# Patient Record
Sex: Male | Born: 1935 | Race: White | Hispanic: No | Marital: Married | Smoking: Former smoker
Health system: Southern US, Community
[De-identification: ages and names within clinical notes are randomized; demographics above are authoritative.]

## PROBLEM LIST (undated history)

## (undated) DIAGNOSIS — C44301 Unspecified malignant neoplasm of skin of nose: Secondary | ICD-10-CM

## (undated) DIAGNOSIS — E782 Mixed hyperlipidemia: Secondary | ICD-10-CM

## (undated) DIAGNOSIS — Z77090 Contact with and (suspected) exposure to asbestos: Secondary | ICD-10-CM

## (undated) DIAGNOSIS — N200 Calculus of kidney: Secondary | ICD-10-CM

## (undated) DIAGNOSIS — J449 Chronic obstructive pulmonary disease, unspecified: Secondary | ICD-10-CM

## (undated) DIAGNOSIS — N289 Disorder of kidney and ureter, unspecified: Secondary | ICD-10-CM

## (undated) DIAGNOSIS — G459 Transient cerebral ischemic attack, unspecified: Secondary | ICD-10-CM

## (undated) DIAGNOSIS — K5790 Diverticulosis of intestine, part unspecified, without perforation or abscess without bleeding: Secondary | ICD-10-CM

## (undated) DIAGNOSIS — I2510 Atherosclerotic heart disease of native coronary artery without angina pectoris: Secondary | ICD-10-CM

## (undated) HISTORY — DX: Calculus of kidney: N20.0

## (undated) HISTORY — DX: Diverticulosis of intestine, part unspecified, without perforation or abscess without bleeding: K57.90

## (undated) HISTORY — DX: Unspecified malignant neoplasm of skin of nose: C44.301

## (undated) HISTORY — DX: Disorder of kidney and ureter, unspecified: N28.9

## (undated) HISTORY — DX: Mixed hyperlipidemia: E78.2

## (undated) HISTORY — DX: Contact with and (suspected) exposure to asbestos: Z77.090

## (undated) HISTORY — DX: Transient cerebral ischemic attack, unspecified: G45.9

## (undated) HISTORY — DX: Atherosclerotic heart disease of native coronary artery without angina pectoris: I25.10

## (undated) HISTORY — DX: Chronic obstructive pulmonary disease, unspecified: J44.9

---

## 2004-08-07 DIAGNOSIS — K5790 Diverticulosis of intestine, part unspecified, without perforation or abscess without bleeding: Secondary | ICD-10-CM

## 2004-08-07 HISTORY — DX: Diverticulosis of intestine, part unspecified, without perforation or abscess without bleeding: K57.90

## 2004-08-07 HISTORY — PX: CORONARY ARTERY BYPASS GRAFT: SHX141

## 2005-03-08 ENCOUNTER — Encounter: Payer: Self-pay | Admitting: Cardiology

## 2005-03-09 ENCOUNTER — Inpatient Hospital Stay (HOSPITAL_COMMUNITY): Admission: AD | Admit: 2005-03-09 | Discharge: 2005-03-20 | Payer: Self-pay | Admitting: Cardiology

## 2005-03-09 ENCOUNTER — Ambulatory Visit: Payer: Self-pay | Admitting: Cardiology

## 2005-03-10 ENCOUNTER — Encounter: Payer: Self-pay | Admitting: Cardiology

## 2005-03-10 ENCOUNTER — Ambulatory Visit: Payer: Self-pay | Admitting: Cardiology

## 2005-03-11 ENCOUNTER — Encounter: Payer: Self-pay | Admitting: Cardiology

## 2005-03-15 ENCOUNTER — Encounter: Payer: Self-pay | Admitting: Cardiology

## 2005-03-18 ENCOUNTER — Encounter: Payer: Self-pay | Admitting: Cardiology

## 2005-03-19 ENCOUNTER — Encounter: Payer: Self-pay | Admitting: Thoracic Surgery (Cardiothoracic Vascular Surgery)

## 2005-04-05 ENCOUNTER — Ambulatory Visit: Payer: Self-pay | Admitting: *Deleted

## 2005-05-16 ENCOUNTER — Ambulatory Visit: Payer: Self-pay | Admitting: Cardiology

## 2007-12-12 ENCOUNTER — Encounter: Payer: Self-pay | Admitting: Cardiology

## 2008-01-27 ENCOUNTER — Encounter: Payer: Self-pay | Admitting: Cardiology

## 2008-01-29 ENCOUNTER — Ambulatory Visit: Payer: Self-pay | Admitting: Cardiology

## 2008-02-06 ENCOUNTER — Ambulatory Visit: Payer: Self-pay | Admitting: Cardiology

## 2009-02-17 ENCOUNTER — Encounter: Payer: Self-pay | Admitting: Cardiology

## 2009-03-04 ENCOUNTER — Encounter: Payer: Self-pay | Admitting: Cardiology

## 2009-11-24 ENCOUNTER — Encounter: Payer: Self-pay | Admitting: Cardiology

## 2010-03-31 ENCOUNTER — Encounter: Payer: Self-pay | Admitting: Cardiology

## 2010-10-06 ENCOUNTER — Ambulatory Visit (INDEPENDENT_AMBULATORY_CARE_PROVIDER_SITE_OTHER): Payer: Medicare Other | Admitting: Cardiology

## 2010-10-06 ENCOUNTER — Encounter: Payer: Self-pay | Admitting: Cardiology

## 2010-10-06 DIAGNOSIS — I2510 Atherosclerotic heart disease of native coronary artery without angina pectoris: Secondary | ICD-10-CM | POA: Insufficient documentation

## 2010-10-06 DIAGNOSIS — J449 Chronic obstructive pulmonary disease, unspecified: Secondary | ICD-10-CM | POA: Insufficient documentation

## 2010-10-06 DIAGNOSIS — J61 Pneumoconiosis due to asbestos and other mineral fibers: Secondary | ICD-10-CM | POA: Insufficient documentation

## 2010-10-06 DIAGNOSIS — Z8679 Personal history of other diseases of the circulatory system: Secondary | ICD-10-CM | POA: Insufficient documentation

## 2010-10-06 DIAGNOSIS — Z87891 Personal history of nicotine dependence: Secondary | ICD-10-CM | POA: Insufficient documentation

## 2010-10-06 DIAGNOSIS — N183 Chronic kidney disease, stage 3 (moderate): Secondary | ICD-10-CM

## 2010-10-06 DIAGNOSIS — J4489 Other specified chronic obstructive pulmonary disease: Secondary | ICD-10-CM | POA: Insufficient documentation

## 2010-10-06 DIAGNOSIS — E782 Mixed hyperlipidemia: Secondary | ICD-10-CM | POA: Insufficient documentation

## 2010-10-06 DIAGNOSIS — R072 Precordial pain: Secondary | ICD-10-CM | POA: Insufficient documentation

## 2010-10-06 DIAGNOSIS — Z951 Presence of aortocoronary bypass graft: Secondary | ICD-10-CM | POA: Insufficient documentation

## 2010-10-06 DIAGNOSIS — I872 Venous insufficiency (chronic) (peripheral): Secondary | ICD-10-CM | POA: Insufficient documentation

## 2010-10-13 NOTE — Cardiovascular Report (Signed)
Summary: Cardiac Catheterization  Cardiac Catheterization   Imported By: Dorise Hiss 10/06/2010 10:45:08  _____________________________________________________________________  External Attachment:    Type:   Image     Comment:   External Document

## 2010-10-13 NOTE — Consult Note (Signed)
Summary: Consultation Report/ CARDIOTHORACIC SURGERY  Consultation Report/ CARDIOTHORACIC SURGERY   Imported By: Dorise Hiss 10/06/2010 10:49:11  _____________________________________________________________________  External Attachment:    Type:   Image     Comment:   External Document

## 2010-10-13 NOTE — Consult Note (Signed)
Summary: CARDIOLOGY CONSULT/ [facility]  CARDIOLOGY CONSULT/ [facility]   Imported By: Zachary George 10/06/2010 10:32:15  _____________________________________________________________________  External Attachment:    Type:   Image     Comment:   External Document

## 2010-10-13 NOTE — Letter (Signed)
Summary: Discharge Summary  Discharge Summary   Imported By: Dorise Hiss 10/06/2010 10:54:44  _____________________________________________________________________  External Attachment:    Type:   Image     Comment:   External Document

## 2010-10-13 NOTE — Op Note (Signed)
Summary: Operative Report  Operative Report   Imported By: Dorise Hiss 10/06/2010 10:51:11  _____________________________________________________________________  External Attachment:    Type:   Image     Comment:   External Document

## 2010-10-25 NOTE — Assessment & Plan Note (Signed)
Summary: 1 yr fu per June reminder  vs   Visit Type:  Follow-up Primary Provider:  Sherril Croon   History of Present Illness: The patient is an 77 year old male seen in our clinic since 2009. He has a history of coronary artery disease, and coronary artery bypass grafting in 2006. He also has a history of asbestos exposure with chronic dyspnea. He has a prior history of TIA with an MRI demonstrating possible narrowing involving the terminal aspect of the right vertebral artery as well as mild to moderate narrowing involving the right middle cerebral artery. Carotid Dopplers currently were normal. At that time also had no residual symptoms. The patient also has history of renal insufficiency with creatinine 1.6. During the office visit in 2009 a Cardiolite study was recommended. The patient has been doing well.  The patient reports leg cramps.  He has been taking quinine which has helped.  He states the cramps occur mostly at night.  He has no symptoms of claudication upon walking.   From a cardiovascular standpoint his been doing well.  He reports no chest pain short of breath or cough or PND.   Preventive Screening-Counseling & Management  Alcohol-Tobacco     Smoking Status: quit     Year Quit: 1972  Current Medications (verified): 1)  Aspir-Low 81 Mg Tbec (Aspirin) .... Take 1 Tablet By Mouth Once A Day 2)  Metoprolol Succinate 50 Mg Xr24h-Tab (Metoprolol Succinate) .... Take 1/2 Tablet By Mouth Once A Day 3)  Flomax 0.4 Mg Caps (Tamsulosin Hcl) .... Take 1 Tablet By Mouth Once A Day 4)  Omeprazole 20 Mg Cpdr (Omeprazole) .... Take 1 Tablet By Mouth Once A Day 5)  Low Pressure Compression Stockings - Knee High .... Wear As Directed  Allergies (verified): 1)  ! * Contrast Dye  Comments:  Nurse/Medical Assistant: The patient's medication list and allergies were reviewed with the patient and were updated in the Medication and Allergy Lists.  Past History:  Past Medical History: Last  updated: 2010-10-26  Coronary artery bypass grafting in 2006.   Renal insufficiency, creatinine 1.6.   Transient ischemic attack, questionable right middle cerebral       artery event.       a.     No definite embolic source.     C O P D/asbestosis Hyperlipidemia Fromer tobacco use Nephrolithiasis Skin Cancer on the nose  Diverticulosis diag 2006 CAD  Family History: Last updated: 10/26/10 Father died in his 40's of old age but had hx of MI  Social History: Last updated: 10/26/10 Retired -[employer] Married  Tobacco Use - Former.   Risk Factors: Smoking Status: quit (10/26/10)  Review of Systems  The patient denies fatigue, malaise, fever, weight gain/loss, vision loss, decreased hearing, hoarseness, chest pain, palpitations, shortness of breath, prolonged cough, wheezing, sleep apnea, coughing up blood, abdominal pain, blood in stool, nausea, vomiting, diarrhea, heartburn, incontinence, blood in urine, muscle weakness, joint pain, leg swelling, rash, skin lesions, headache, fainting, dizziness, depression, anxiety, enlarged lymph nodes, easy bruising or bleeding, and environmental allergies.    Vital Signs:  Patient profile:   77 year old male Height:      71 inches Weight:      213 pounds BMI:     29.81 O2 Sat:      97 % on Room air Pulse rate:   77 / minute BP sitting:   125 / 78  (left arm) Cuff size:   large  Vitals Entered By: Isabelle Course  Anderson (October 06, 2010 1:40 PM)  Nutrition Counseling: Patient's BMI is greater than 25 and therefore counseled on weight management options.  O2 Flow:  Room air  Physical Exam  Additional Exam:  General: Well-developed, well-nourished in no distress head: Normocephalic and atraumatic eyes PERRLA/EOMI intact, conjunctiva and lids normal nose: No deformity or lesions mouth normal dentition, normal posterior pharynx neck: Supple, no JVD.  No masses, thyromegaly or abnormal cervical nodes lungs: Normal breath sounds  bilaterally without wheezing.  Normal percussion heart: regular rate and rhythm with normal S1 and S2, no S3 or S4.  PMI is normal.  No pathological murmurs abdomen: Normal bowel sounds, abdomen is soft and nontender without masses, organomegaly or hernias noted.  No hepatosplenomegaly musculoskeletal: Back normal, normal gait muscle strength and tone normal pulsus: Pulse is normal in all 4 extremities Extremities: No peripheral pitting edema, chronic venous insufficiency neurologic: Alert and oriented x 3 skin: Intact without lesions or rashes cervical nodes: No significant adenopathy psychologic: Normal affect    Impression & Recommendations:  Problem # 1:  CORONARY ATHEROSCLEROSIS NATIVE CORONARY ARTERY (ICD-414.01) Coronary artery disease: No recurrent chest pain.  No immediate indication for ischemia testing.  The patient's EKG was reviewed.  There is normal sinus rhythm with no acute ischemic changes or prior infarct patterns.  Coronary artery bypass grafting  His updated medication list for this problem includes:    Aspir-low 81 Mg Tbec (Aspirin) .Marland Kitchen... Take 1 tablet by mouth once a day    Metoprolol Succinate 50 Mg Xr24h-tab (Metoprolol succinate) .Marland Kitchen... Take 1/2 tablet by mouth once a day  Orders: T-Basic Metabolic Panel [phone]) T-Magnesium [phone])  Problem # 2:  TRANSIENT ISCHEMIC ATTACKS, HX OF (ICD-V12.50) History of TIA: Abnormal MRI with narrowing involving the right middle cerebral artery. Symptoms resolved.  Problem # 3:  CHRONIC KIDNEY DISEASE STAGE III (MODERATE) (ICD-585.3) Chronic renal insufficiency: Creatinine 1.6 in 2009: Rule follow-up with an electrolyte panel and a magnesium level  Problem # 4:  ASBESTOSIS (ICD-501) Asbestos exposure: Follow up at the [place].  Problem # 5:  VENOUS INSUFFICIENCY, CHRONIC (ICD-459.81) Venous insufficiency: The patient has no evidence of arterial insufficiency.  We will prescribed compression stockings.  Other  Orders: EKG w/ Interpretation (93000)  Patient Instructions: 1)  Labs:  BMET, Magnesium 2)  Low pressure compression stockings - knee high 3)  Follow up in  6 months Prescriptions: LOW PRESSURE COMPRESSION STOCKINGS - KNEE HIGH wear as directed  #1 x 1   Entered by:   Hoover Brunette, LPN   Authorized by:   Lewayne Bunting, MD, [facility]   Signed by:   Hoover Brunette, LPN on 29/56/2130   Method used:   Print then Give to Patient   RxID:   [id number]

## 2010-12-20 NOTE — Assessment & Plan Note (Signed)
[facility]                          [facility] NOTE   NAME:Roger [name], Roger [name]                     MRN:          [id number]  DATE:01/29/2008                            DOB:          1928/01/07    HISTORY OF PRESENT ILLNESS:  The patient is a 77 year old male with a  history of coronary artery disease, status post coronary artery bypass  graft in 2006.  The patient has a prior history of asbestos exposure.  He has chronic dyspnea and moderate-to-significant exertion.  The  patient was recently seen in the emergency room because of episodes of  dysarthria and facial weakness.  It was felt that he had a TIA.  An MRI  demonstrates possible narrowing involving the terminal aspect of right  vertebral artery as well as mild-to-moderate narrowing involving the  right middle cerebral artery.  The patient was discharged on aspirin.  He was not given Plavix.  There is apparently a history of side effects  and bleeding with Plavix.  He has had no residual symptoms.  He has told  me that he had carotid Dopplers done, and he confirmed that to me.  He  also has an echo done in Dr. Sherril Croon' office, but that showed no source of  emboli per the patient's report.  He denies, however, any significant  chest pain.  He has not had a perfusion study since coronary artery  bypass grafting.  His electrocardiogram in the office today demonstrates  normal sinus rhythm with no ischemic changes with left ventricular  hypertrophy.   MEDICATIONS:  1. Aspirin 81 mg p.o. daily.  2. Flomax 0.4 mg p.o. daily.  3. Metoprolol ER 50 mg p.o. daily.  4. Lipitor 10 mg p.o. daily.  5. Prilosec 40 mg p.o. daily.   PHYSICAL EXAMINATION:  VITAL SIGNS:  Blood pressure 108/68, heart rate  76, and weight 216 pounds.  NECK:  Normal carotid upstrokes, no carotid bruits.  LUNGS:  Clear breath sounds bilaterally.  HEART:  Regular rate and rhythm.  Normal S1 and S2.  No murmurs or  gallops.  ABDOMEN:  Soft and nontender.  No rebound or guarding.  Good bowel  sounds.  EXTREMITIES:  No cyanosis, clubbing, or edema.   PROBLEM LIST:  1. Coronary artery bypass grafting in 2006.  2. Renal insufficiency, creatinine 1.6.  3. Transient ischemic attack, questionable right middle cerebral      artery event.      a.     No definite embolic source.   PLAN:  1. The patient will have a Cardiolite study done, as he had no      ischemia evaluation since 2006.  2. The patient apparently could not take Plavix, but this can be      further explored by Dr. Sherril Croon.  3. No definite source of cardiogenic embolism, however.  4. From a cardiac perspective, the patient is otherwise stable.  I      made no changes in his medical regimen.     Learta Codding, MD,FACC  Electronically Signed    GED/MedQ  DD: 01/31/2008  DT:  02/01/2008  Job #: [id number]

## 2010-12-23 NOTE — Discharge Summary (Signed)
NAMEBOUBACAR, LERETTE NO.:  [id number]   MEDICAL RECORD NO.:  [id number]           PATIENT TYPE:   LOCATION:  2023                         FACILITY:  [facility]   PHYSICIAN:  Salvatore Decent. Cornelius Moras, M.D. DATE OF BIRTH:  1928/04/29   DATE OF ADMISSION:  03/09/2005  DATE OF DISCHARGE:                                 DISCHARGE SUMMARY   PRIMARY DIAGNOSIS:  Severe three-vessel coronary artery disease with new  onset symptoms of angina.   IN-HOSPITAL DIAGNOSES:  1.  Bleeding, status post coronary artery bypass grafting.  2.  Volume overload.  3.  Acute renal insufficiency postoperatively.   SECONDARY DIAGNOSES:  1.  Hyperlipidemia.  2.  Remote tobacco use.  3.  Remote history of asbestos exposure.  4.  Kidney stones.  5.  Benign prostatic hypertrophy.  6.  Skin cancer.   ALLERGIES:  No known drug allergies.   IN-HOSPITAL OPERATIONS/PROCEDURES:  1.  Cardiac catheterization with left heart catheterization, selective      coronary arteriography, selective left ventriculography, subclavian      angiography.  2.  Intraoperative transesophageal echocardiogram.  3.  Coronary artery bypass grafting x4 using a left internal mammary artery      to distal left anterior descending coronary artery, saphenous vein graft      to medial sub-branch of circumflex marginal branch, a sequential vein      graft to the lateral sub-branch of the circumflex marginal branch,      saphenous vein graft to posterior descending coronary artery.  Noted      endoscopic vein harvesting was done from the right thigh.  4.  Reexploration for bleeding.   HISTORY AND PHYSICAL/HOSPITAL COURSE:  Mr. Matlack is a 77 year old, retired,  [employer] male from [place], [place], with no previous history of coronary  artery disease and risk factors notable only for a remote history of tobacco  use and a history of hyperlipidemia.  Patient remains quite active  physically and is now an avid golfer.  He presents  with a one-week history  of new onset symptoms of classical angina that he describes as a substernal,  pressure-like pain and has typically been brought on with physical activity  or while he is outside in the extreme heat.  The pain only seems to be  relieved by sitting down and resting and getting out of the heat.  The pain  has occurred off and on over the last week, although he has remained active  physically otherwise.  He presented to [facility] on August  2nd where baseline electrocardiograms revealed a sinus rhythm with inferior  T-wave inversions.  Troponins were borderline elevated at 0.07 and 0.09.  He  was then transferred to [facility] where he underwent cardiac  catheterization by Dr. Riley Kill.  This was done on March 10, 2005.  Findings  at the time of catheterization included severe three-vessel coronary artery  disease with normal left ventricular function.  CVTS was then consulted.  The patient was seen and evaluated by Dr. Cornelius Moras.  Dr. Cornelius Moras discussed with  patient  undergoing coronary artery bypass grafting for these lesions.  He  discussed the risks and benefits of this procedure.  The patient  acknowledged understanding and wished to proceed.  He was scheduled for  surgery for March 15, 2005.  The patient underwent preoperative bilateral  carotid duplex ultrasound, which showed no evidence of significant ICA  stenosis.  The patient also had preoperative ABIs, which were within normal  limits.  On March 14, 2005, ultrasound was done to evaluate for a possible  pseudoaneurysm.  This showed to be negative for a pseudoaneurysm, but a  minor-sized hematoma in the groin.  The patient was stable.  The patient had  no other complications prior to surgery.   Patient was taken to the operating room on March 15, 2005, where he  underwent coronary artery bypass grafting x4 using a left internal mammary  artery graft to the distal left anterior descending  coronary artery, a  saphenous vein graft to medial sub-branch of circumflex marginal branch and  sequential saphenous vein graft to the lateral sub-branch of the circumflex  marginal branch, a saphenous vein to posterior descending coronary artery.  Noted endoscopic vein harvesting from the right thigh was done.  The patient  tolerated this procedure well and was transferred up to the ICU in stable  condition.  Following surgery, the patient seemed to be hemodynamically  stable, although his chest tube output has an excess of 900 mL since the OR.  The patient's platelet count was 93.  The patient received 2 units of fresh  frozen plasma and was taken back to the OR for bleeding, status post  coronary artery bypass grafting.  Dr. Cornelius Moras did reexploration for bleeding  and the bleeding was controlled, and patient was transferred back up to the  ICU.  The patient was extubated later in the later evening or early morning  following take back.  He was deemed to be hemodynamically stable following  take back.  Postoperatively, the patient remained hemodynamically stable.  He remained in normal sinus rhythm.  Chest tubes and lines were dc'd, day 1.  The patient was out of bed and ambulating well.  He was deemed to have  volume overload and started on diuretics.  The patient was transferred out  to 2000, postop day 2.  Day 3, the patient was deemed to have acute renal  insufficiency with a creatinine of 1.7.  The Lasix dose was decreased.  This  was followed.  The patient's incisions were dry and intact and healing well.  He was able to weaned off the oxygen on day 4, satting 92 to 93% on room  air.  The patient's acute renal insufficiency had improved over the next  several days.  His external pacing wires were dc'd on the morning of day 4.   The patient is tentatively ready for discharge postoperative day 5 in stable condition.  He was able to remain weaned off the oxygen.  He remained in  normal  sinus rhythm, and incisions dry and intact and healing well.  Respiratory, the patient did have positive rales noted in the bilateral  bases.  He was encouraged to continue his incentive spirometer.  The  patient's vital signs were stable at discharge and he was afebrile.   A followup appointment will be arranged with Dr. Cornelius Moras in 3 weeks.  The  patient will follow up with Dr. Riley Kill in 2 weeks.  He will obtain a PA and  lateral chest x-ray at Dr.  Stuckey's appointment and will then bring with  him to Dr. Orvan July appointment.  Mr. Doughtie received instructions on diet,  activity level, and incisional care.  He was told no driving until released  to do so, no heavy lifting over 10 pounds.  He was told that he is allowed  to shower, washing his incisions using soap and water.  He is to contact the  office if he develops any drainage or opening from any of his incision  sites.  The patient acknowledges understanding.  Mr. Mcburney was educated on  diet to be a low fat, low salt.  He was informed to ambulate 3 to 4 times  per day, progress as tolerated, and continue his breathing exercises.  He  again acknowledges understanding.   DISCHARGE MEDICATIONS:  1.  Aspirin 325 mg p.o. daily.  2.  Toprol-XL 50 mg p.o. daily.  3.  Lipitor 20 mg p.o. q.h.s.  4.  Flomax 0.4 mg p.o. daily.  5.  Lasix 40 mg p.o. daily x5 days.  6.  Potassium chloride 20 mEq p.o. daily x5 days.  7.  Oxycodone 5 mg 1 to 2 tabs p.o. q.4-6h p.r.n. pain.      Theda Belfast, PA      Salvatore Decent. Cornelius Moras, M.D.  Electronically Signed    KMD/MEDQ  D:  03/19/2005  T:  03/19/2005  Job:  [id number]   cc:   Arturo Morton. Riley Kill, M.D. [facility]

## 2010-12-23 NOTE — Op Note (Signed)
Roger [name], Roger [name] NO.:  [id number]   MEDICAL RECORD NO.:  [id number]          PATIENT TYPE:  INP   LOCATION:  2305                         FACILITY:  [facility]   PHYSICIAN:  Kaylyn Layer. Michelle Piper, M.D.   DATE OF BIRTH:  Aug 06, 1928   DATE OF PROCEDURE:  03/15/2005  DATE OF DISCHARGE:                                 OPERATIVE REPORT   PROCEDURE PERFORMED:  Transesophageal echo probe insertion and monitoring  during coronary artery bypass grafting.   INDICATIONS FOR PROCEDURE:  I was consulted by Salvatore Decent. Cornelius Moras, M.D. to  place a transesophageal echo probe into Mr. Dunkley for intraoperative  monitoring during his coronary artery bypass grafting procedure.  Mr. Cavanagh  is a 77 year old gentleman with three-vessel coronary artery disease who  presents to the operating room for coronary artery bypass grafting.   DESCRIPTION OF PROCEDURE:  After uneventful induction of anesthesia and  endotracheal intubation, the transesophageal echo probe was easily passed  into the stomach.  Initial short axis view of the left ventricle showed mild  left ventricular hypertrophy in all quadrants and no wall motion  abnormalities.  Turning to the four-chamber view, the mitral valve seemed to  coapt normally in all quadrants in all views and the anterior mitral leaflet  did seem a little redundant.  However, there was no mitral valve prolapse.  On placing Color Flow Doppler across the valve, there was a distinct trace  to 1+ jet of mitral regurgitation into the left atrium.  The left atrial  size was normal and flow in the pulmonary veins was forward.  Interatrial  septum was normal.  Turning to the aortic valve, it was tricuspid,  structurally looked normal.  On placing Color Flow Doppler across the valve  in the longitudinal view, there was trace to 1+ aortic insufficiency.  There  was a small central jet that did not extend past the anterior leaflet of the  mitral valve in the longitudinal  view.  The right side of the heart was  normal.   The patient was placed on cardiopulmonary bypass by Dr. Cornelius Moras, underwent a  coronary artery bypass grafting.  At the end of the procedure, the patient  was discontinued from cardiopulmonary bypass with minimal inotropes and at  this time evaluation showed left ventricle in short axis view to be  unchanged.  There is mild left ventricular hypertrophy with no wall motion  abnormalities and the ventricle seemed to contract normally.  In the 4-  chamber view, the mitral valve was unchanged from preop.  Structure looked  normal and on placing Color Flow Doppler across the valve, there was 1 to 2+  mitral regurgitation.  Left atrium was normal.  Interatrial septum was  normal.  Aortic valve was unchanged.  There was still trace to 1+ aortic  insufficiency.  The right side of the heart was within normal limits.   At end of the procedure, the transesophageal echo probe was removed from the  patient and the patient was taken intubated in stable condition to the  intensive care unit.  KDO/MEDQ  D:  03/15/2005  T:  03/16/2005  Job:  [id number]

## 2010-12-23 NOTE — Op Note (Signed)
NAMESAMMIE, DENNER NO.:  [id number]   MEDICAL RECORD NO.:  [id number]          PATIENT TYPE:  INP   LOCATION:  2305                         FACILITY:  [facility]   PHYSICIAN:  Salvatore Decent. Cornelius Moras, M.D. DATE OF BIRTH:  [birth year]   DATE OF PROCEDURE:  03/15/2005  DATE OF DISCHARGE:                                 OPERATIVE REPORT   PREOPERATIVE DIAGNOSIS:  Severe 3-vessel coronary artery disease with new-  onset symptoms angina.   POSTOPERATIVE DIAGNOSIS:  Severe 3-vessel coronary artery disease with new-  onset symptoms angina.   PROCEDURE:  Median sternotomy for coronary artery bypass grafting x4 (left  internal mammary artery to distal left anterior descending coronary artery,  saphenous vein graft to medial sub-branch of circumflex marginal branch and  sequential saphenous vein graft to the lateral sub-branch of the circumflex  marginal branch, saphenous vein graft to posterior descending coronary  artery, endoscopic saphenous vein harvest from right thigh).   SURGEON:  Dr. Purcell Nails.   ASSISTANT:  Coral Ceo, P.A.   ANESTHESIA:  General   BRIEF CLINICAL NOTE:  The patient is a 77 year old gentleman from [place],  [place] who presents with new-onset symptoms of angina. He was  initially evaluated at [facility]; and he was transferred to  [facility] where he underwent cardiac catheterization by Dr. Bonnee Quin on August4. Findings were notable for the presence of severe 3-  vessel coronary artery disease with normal left ventricular function. A full  consultation note has been dictated previously.   OPERATIVE CONSENT:  The patient and his family have been counseled at length  regarding the indications and potential benefits of coronary artery bypass  grafting. Alternative treatment strategies have been discussed. They  understand and accept all associated risks of surgery and desire to proceed  as described.   OPERATIVE FINDINGS:  1.  Normal left ventricular systolic function.  2.  Mild aortic insufficiency.  3.  Mild mitral regurgitation.  4.  Good-quality left internal mammary artery and saphenous vein conduit for      grafting.  5.  Good-quality distal target vessels for grafting.   OPERATIVE NOTE DETAIL:  The patient is brought to the operating room on the  and above-mentioned date and central monitoring is established by the  anesthesia service under the care and direction of Dr. Arta Bruce.  Specifically, a Swan-Ganz catheter is placed through the right internal  jugular approach. A radial arterial line was placed. Intravenous antibiotics  are administered. Following induction with general endotracheal anesthesia,  a Foley catheter is placed. The patient's chest, abdomen, both groins, and  both lower extremities are prepared, and draped in a sterile manner.   Baseline transesophageal echocardiogram is performed by Dr. Michelle Piper. This  demonstrates normal left ventricular function. There is mild aortic  insufficiency and mild mitral regurgitation.   A median sternotomy incision is performed and left internal mammary artery  is dissected from the chest wall and prepared for bypass grafting. The left  internal mammary artery is good-quality conduit. Simultaneously saphenous  vein  is obtained from the patient's right thigh using endoscopic vein  harvest technique. The saphenous vein is good-quality conduit. After the  saphenous vein is removed, the small incisions in the right leg are closed  in multiple layers with running absorbable suture.   The patient is heparinized systemically. The pericardium is opened. The  ascending aorta is mildly sclerotic but otherwise normal in appearance. The  ascending aorta and the right atrium are cannulated for cardiopulmonary  bypass. Cardiopulmonary bypass is begun and the surface of the heart is  inspected. Distal sites are selected for coronary bypass  grafting. Portions  of saphenous vein in the left internal mammary artery are trimmed to  appropriate length. A temperature probe is placed the left ventricular  septum. A cardioplegic catheter is placed in the ascending aorta.   The patient is cooled to 32-degrees systemic temperature. The aortic  crossclamp is applied and cardioplegia is delivered in an antegrade fashion  through the aortic root. Iced saline slush is applied for topical  hypothermia. The initial cardioplegic arrest and myocardial cooling are felt  to be excellent. Repeat doses of cardioplegia are administered  intermittently throughout the crossclamp portion of the operation, both  through the aortic root and down the subsequently placed vein grafts, to  maintain left ventricular septal temperature below 15-degrees centigrade.   The following distal coronary anastomoses are performed:  1.  Posterior descending coronary artery is grafted with a saphenous vein      graft in an end-to-side fashion. This vessel measures 1.8 mm in diameter      and is good quality at the site of distal bypass.  2.  The medial subbranch of the circumflex marginal branch is grafted with a      saphenous vein graft in a side-to-side fashion using a natural Y of the      saphenous vein conduit. The circumflex marginal is a large bifurcating      system. The medial subbranch is the smaller of the two branches. It      measures 1.1 mm in diameter at the site of distal bypass.  3.  The lateral subbranch of the circumflex marginal branch is grafted with      a sequential saphenous vein graft using the vein placed to the medial      subbranch of the same vessel. This vessel measures 1.7 mm in diameter      and is of good quality at the site of distal bypass.  4.  The distal left anterior descending coronary artery is grafted with the      left internal mammary artery in an end-to-side fashion. This vessel     measures 2.0 mm in diameter and is of  good quality at the site of distal      bypass.   Both proximal saphenous vein anastomoses are performed directly to the  ascending aorta prior to removal of the aortic crossclamp. The left  ventricular septal temperature rises rapidly with reperfusion of the left  internal mammary artery. The aortic crossclamp is removed after total  crossclamp time of 63 minutes. The heart begins to beat spontaneously. All  proximal and distal coronary anastomoses are inspected for hemostasis in  appropriate graft orientation. Epicardial pacing wires are fixed to the  right ventricular outflow tract and to the right atrial appendage. The  patient is rewarmed to 37-degrees centigrade temperature. The patient is  weaned from cardiopulmonary bypass without difficulty. The patient's rhythm  at separation from bypass  is normal sinus rhythm. No inotropic support is  required. Total cardiopulmonary bypass time for the operation 77 minutes.   Follow-up transesophageal echocardiogram performed by Dr. Michelle Piper after  separation from bypass demonstrates no significant changes with preserved  left ventricular function.   The venous and arterial cannulae are removed uneventfully. Protamine is  administered to reverse the anticoagulation. The mediastinum and the left  chest are irrigated with saline solution containing vancomycin. Meticulous  surgical hemostasis is ascertained. The mediastinum in the left chest are  drained using 3 chest tubes exited through separate stab incisions  inferiorly. The median sternotomy is closed in routine fashion using single  strength sternal wire. The soft tissues anterior to the sternum are closed  in multiple layers and the skin is closed with running subcuticular skin  closure.   The patient tolerated the procedure well and is transported to the surgical  intensive care unit in stable condition. There are no intraoperative  complications. All sponge, instrument and needle counts  are verified correct  at completion of the operation. No blood products were administered.       CHO/MEDQ  D:  03/15/2005  T:  03/16/2005  Job:  [id number]   cc:   Learta Codding, M.D. [facility]  [address]  [address]  [place]  [place] [address]   Arturo Morton. Riley Kill, M.D. [facility]  [address]  [address]  [place]  [place] [address]   Doreen Beam  [address]  [place]  [place] [address]  Fax: [phone]

## 2010-12-23 NOTE — Consult Note (Signed)
NAMEBROADUS, COSTILLA NO.:  [id number]   MEDICAL RECORD NO.:  [id number]          PATIENT TYPE:  INP   LOCATION:  2922                         FACILITY:  [facility]   PHYSICIAN:  Salvatore Decent. Cornelius Moras, M.D. DATE OF BIRTH:  [birth year]   DATE OF CONSULTATION:  03/11/2005  DATE OF DISCHARGE:                                   CONSULTATION   CARDIOTHORACIC SURGERY CONSULTATION REPORT   REQUESTING PHYSICIAN:  Dr. Arturo Morton. Stuckey.   PRIMARY CARDIOLOGIST:  Dr. Learta Codding   PRIMARY CARE PHYSICIAN:  Dr. Doreen Beam   REASON FOR CONSULTATION:  Severe 3-vessel coronary artery disease with new  onset symptoms angina.   HISTORY OF PRESENT ILLNESS:  Mr. Mellinger is a 77 year old retired [employer] male  from [place], [place] with no previous history of coronary artery  disease and risk factors notable only for a remote history of tobacco use  and history of hyperlipidemia. The patient remains quite active physically  and is now an avid golfer.  He presents with a 1-week history of new onset  symptoms of classical angina. He describes a substernal pressure-like pain  that has typically been brought on with physical activity or while he is  outside in the extreme heat. The pain always seems to be relieved by sitting  down and resting and getting out of the heat. They have occurred off-and-on  over the last week, although he has remained active physically otherwise. He  presented to [facility] on August 2 where baseline  electrocardiograms revealed sinus rhythm with inferior T-wave inversions.  Troponins were borderline elevated at 0.07 and 0.09.  He was transferred to  [facility] where he underwent cardiac catheterization, yesterday,  by Dr. Riley Kill. Findings at the time of catheterization include severe 3-  vessel coronary artery disease with normal left ventricular function.  Cardiac surgical consultation has been requested to consider surgical  revascularization.   REVIEW OF SYSTEMS:  GENERAL: The patient reports feeling well otherwise. He  has good appetite. He lost 20 pounds in weight over 6 months on a strict  diet regimen although he admits that he is already gained most of it back  recently. CARDIAC: Notable for new onset symptoms of angina. The patient  denies any symptoms of chest pain at rest or with minimal activity. He  denies any nocturnal chest pain. He has stable chronic mild exertional  shortness of breath that does not seem to limit him and this has not changed  recently. He denies resting shortness of breath, PND, orthopnea, or lower  extremity edema. He denies palpitations or syncope. RESPIRATORY: Notable for  stable chronic exertional shortness of breath. The patient blames this on  history of asbestosis but his symptoms have not changed. He denies  productive cough, hemoptysis, wheezing. GASTROINTESTINAL: Negative. The  patient has no difficulty swallowing. He reports normal bowel function. He  denies history of hematochezia, hematemesis, melena. He apparently had a  colonoscopy performed in the rate in the near past that revealed only a few  diverticula. MUSCULOSKELETAL: Negative. The patient denies  significant  problems with arthritis or arthralgias. NEUROLOGIC: Negative. HEENT: Notable  in that the patient has been treated recently, by his dentist, for a root  canal. He also has 1 tooth that is slightly loose in his upper jaw.  His eye  sight is stable. PSYCHIATRIC: Negative. ENDOCRINE: Negative.   PAST MEDICAL HISTORY:  1.  Hyperlipidemia.  2.  Remote tobacco use.  3.  Remote history asbestos exposure  4.  Kidney stones.  5.  Benign prostatic hypertrophy.  6.  Skin cancer.   PAST SURGICAL HISTORY:  Excision of skin cancer from his face and bilateral  cataract extraction.   FAMILY HISTORY:  The patient's father suffered a myocardial infarction in  the 75s but went on and lived into his 28s.    SOCIAL HISTORY:  The patient is retired from [employer] and [place]. He is an avid golfer and remains active  physically. He quit smoking 25 years ago. He denies history of significant  alcohol consumption.   MEDICATIONS:  Prior to admission, Flomax.   DRUG ALLERGIES:  None known.   PHYSICAL EXAM:  GENERAL:  The patient is a well-appearing [place] male who  appears his stated age in no acute distress.  VITAL SIGNS:  He is currently afebrile and normotensive. He is in normal  sinus rhythm.  HEENT:  Exam is grossly unrevealing.  NECK:  Supple. There is no cervical or supraclavicular lymphadenopathy.  There is no jugular venous distension. No carotid bruits are noted.  CHEST:  Auscultation of the chest is notable for clear AND symmetrical  breath sounds bilaterally. No wheezes or rhonchi demonstrated.  CARDIOVASCULAR:  Exam includes regular rate and rhythm. No murmurs, rubs or  gallops ARE noted.  ABDOMEN:  The abdomen is soft, nontender. There are no palpable masses.  Bowel sounds present. EXTREMITIES:  Warm and well-perfused. There is no  lower extremity edema. Distal pulses are palpable in both lower legs at the  ankles. There is no sign of significant venous insufficiency.  RECTAL AND GU EXAMS:  Both deferred.  NEUROLOGIC:  Examination is grossly nonfocal.  SKIN:  Clean and dry, healthy-appearing throughout.   DIAGNOSTIC TEST:  Cardiac catheterization, performed by Dr. Riley Kill, is  reviewed. This demonstrates severe 3-vessel coronary artery disease with  normal left ventricular function. Specifically, there is 95% stenosis of the  proximal left anterior descending coronary artery. This vessel is a very  large vessel that wraps the apex and fills the posterior descending coronary  artery via collaterals. There is 95% stenosis of left circumflex coronary  artery involving bifurcation of a large circumflex marginal system with a small-to-medium  first subbranch and a large second subbranch. There is 100%  proximal occlusion of the right coronary artery with left-to-right  collateral filling of the posterior descending coronary artery. Left  ventricular function appears normal.   IMPRESSION:  Severe 3-vessel coronary artery disease with new onset symptoms  of angina consistent with acute coronary syndrome. I believe that Mr. Ciotti  would best be treated by elective coronary artery bypass grafting.   PLAN:  I have discussed the options at length with Mr. Plog and his family.  Alternative treatment strategies have been discussed. They understand and  accept all associated risks of surgery including but not limited to risk of  death,  stroke, myocardial infarction, congestive heart failure, respiratory  failure, pneumonia, bleeding requiring blood transfusion, arrhythmia,  infection, and recurrent coronary artery disease. All  their questions have  been addressed. We tentatively plan to proceed with surgery on Wednesday  August 9.       CHO/MEDQ  D:  03/11/2005  T:  03/12/2005  Job:  [id number]   cc:   Learta Codding, M.D. [facility]  [address]  [address]  [place]  [place] [address]   Arturo Morton. Riley Kill, M.D. [facility]  [address]  [address]  [place]  [place] [address]   Doreen Beam  [address]  [place]  [place] [address]  Fax: [phone]   [facility] Cardiology Office  [place]  [place]

## 2010-12-23 NOTE — Cardiovascular Report (Signed)
Roger [name], DEROSSETT NO.:  [id number]   MEDICAL RECORD NO.:  [id number]          PATIENT TYPE:  INP   LOCATION:  4711                         FACILITY:  [facility]   PHYSICIAN:  Arturo Morton. Riley Kill, M.D. [facility] OF BIRTH:  08-Jun-1928   DATE OF PROCEDURE:  03/10/2005  DATE OF DISCHARGE:                              CARDIAC CATHETERIZATION   INDICATIONS:  The patient is a 77 year old gentleman who presents with a non-  ST-elevation MI.  He has had borderline troponins. The current study was  done to assess coronary anatomy.   PROCEDURES:  1.  Left heart catheterization.  2.  Selective coronary arteriography.  3.  Selective left ventriculography.  4.  Subclavian angiography.   DESCRIPTION OF PROCEDURE:  The patient was brought to the cath lab and  prepped and draped in usual fashion.  Through an anterior puncture the right  femoral artery was easily entered.  A 6-French sheath was placed.  Views of  the left-and-right coronary arteries were obtained in multiple angiographic  projections. Central aortic and left ventricular pressures were measured  with a pigtail. Ventriculography was performed in the RAO projection. We did  a hand injection into the central aorta to exclude aortic regurgitation. We  also did distal aortography to evaluate the area where there was initial  hang up of the wire. The patient tolerated procedure well. I held the groin  for the first 10 minutes; and the technical staff held for the second 10  minutes following the procedure. There were no complications.   HEMODYNAMIC DATA:  1.  Central aortic pressure 172/78, mean 114  2.  Left atrial pressure 168/16.  3.  No gradient pullback across aortic valve.  4.  Intravenous nitroglycerin was started at the completion of procedure.   ANGIOGRAPHIC DATA:  1.  Ventriculography was performed in the RAO projection. Overall systolic      function appeared well preserved. No segmental abnormalities  contraction      were identified  2.  A hand injection into the proximal aorta demonstrated no significant      aortic regurgitation.  3.  Distal aortography revealed bilateral patent renal arteries. There is a      fair amount of atherosclerotic change at the distal aortic area just      above the bifurcation.  There is also some atherosclerotic plaquing      within the right iliac.  There is minor plaquing in the proximal left      iliac.  Critical stenosis is not noted.  4.  The subclavian is patent and demonstrates a patent subclavian artery,      and patent internal mammary.  There is perhaps some mild narrowing at      the ostium of the vertebral artery. There is calcification in the      external part of the vessel.  5.  The right coronary artery is severely diseased proximally with an 80%      segmental narrowing and is totally occluded. There are bridging      collaterals to the distal vessel.  6.  The left main coronary artery is free of critical disease.  7.  The left anterior descending artery courses to the apex. There is a 90%      stenosis proximally. This 90% stenosis is after the first tiny diagonal      and straddles the second diagonal which itself has about 90% narrowing.      The distal vessel supplies a complete collateral to the posterior      descending and posterolateral system of the right coronary artery  It      completely fills retrograde in the vessel.  8.  The circumflex provides an AV circumflex and then there is a complex      stenosis involving the circumflex vessel. There is a 90% stenosis      followed by an 80% stenosis after the takeoff of the marginal.  The      marginal, itself, has about 78% ostial narrowing as well.   CONCLUSIONS:  1.  Well-preserved left ventricular function.  2.  Severe 3-vessel coronary disease with total occlusion of the right      coronary artery and high-grade lesions in the LAD and circumflex as      described above.   3.  Patent internal mammary vessel.   DISPOSITION:  We have started the patient on intravenous nitroglycerin and  are moving him to the Transitional Care Unit.  Low dose IV heparin is  recommended.  Surgical consultation has been obtained with Dr. __________  and I have made a call to him and he will see the patient in the morning for  possible surgery at the beginning of the week.       TDS/MEDQ  D:  03/10/2005  T:  03/11/2005  Job:  [id number]   cc:   Thomas C. Wall, M.D.   [facility]

## 2010-12-23 NOTE — Op Note (Signed)
[name], [name] NO.:  [id number]   MEDICAL RECORD NO.:  [id number]          PATIENT TYPE:  INP   LOCATION:  2305                         FACILITY:  [facility]   PHYSICIAN:  Salvatore Decent. Cornelius Moras, M.D. DATE OF BIRTH:  Jan 30, 1928   DATE OF PROCEDURE:  03/15/2005  DATE OF DISCHARGE:                                 OPERATIVE REPORT   PREOPERATIVE DIAGNOSIS:  Bleeding status post coronary artery bypass  grafting.   POSTOPERATIVE DIAGNOSIS:  Bleeding status post coronary artery bypass  grafting.   PROCEDURE:  Re-exploration for bleeding.   SURGEON:  Dr. Purcell Nails.   ASSISTANT:  Melvia Heaps, RNFA   ANESTHESIA:  General.   BRIEF CLINICAL NOTE:  The patient is a 77 year old male who underwent  coronary artery bypass grafting x 4 on the morning of March 15, 2005. After  returned to the surgical intensive care unit, the patient was noted to have  unacceptably high chest tube output. Coagulation parameters were mildly  abnormal, the chest tube output was felt to be excessive. The patient  remained hemodynamically stable. The patient was transfused platelets and  fresh frozen plasma to correct the coagulation abnormality and is now  brought directly to the operating room for re-exploration. The patient's  wife and family understands and provides consent for the procedure.   OPERATIVE NOTE IN DETAIL:  The patient brought directly from the surgical  intensive care unit to the operating room where he was replaced in the  supine position on the operating table. Adequate general endotracheal  anesthesia was verified under the care and direction of Dr. Michelle Piper. The  patient's existing chest tubes were removed. The patient's anterior chest  prepared and draped in sterile manner. The median sternotomy incision was  reopened. The sternal wires were removed. There was relatively minimal  amount of blood within the mediastinum and pleural space and what remaining  blood is  present is evacuated and the chest was irrigated with copious warm  saline solution. There is a single site of significant mechanical bleeding  identified in the midportion of saphenous vein graft to the posterior  descending coronary artery. In the midportion of this vein graft there is a  small bleeder along the wall of the vein at the base of the side branch.  This was controlled with a single 7-0 Prolene suture.  Further exhaustive  search for any other sites of mechanical bleeding was performed. There is  some mild bleeding from along the sternal edge that is easily controlled  with electrocautery. No other significant bleeding is encountered. There  does not appear to be significant coagulopathy. The mediastinum and left  chest are again irrigated with warm saline solution. The mediastinum and  left chest are drained using three chest tubes exited through the same  previous stab incisions inferiorly. The median sternotomy is reclosed with  single strength sternal wire and the soft tissues  anterior to sternum are closed in multiple layers. The skin is closed with  subcuticular skin closure. The patient is transported back to the surgical  intensive care  unit in stable condition. There are no intraoperative  complications. All sponge, instrument and needle counts were verified  correct at completion of the operation.       CHO/MEDQ  D:  03/15/2005  T:  03/16/2005  Job:  [id number]

## 2012-01-19 ENCOUNTER — Encounter: Payer: Self-pay | Admitting: *Deleted

## 2012-01-23 ENCOUNTER — Encounter: Payer: Self-pay | Admitting: Cardiology

## 2012-01-23 ENCOUNTER — Ambulatory Visit (INDEPENDENT_AMBULATORY_CARE_PROVIDER_SITE_OTHER): Payer: Medicare Other | Admitting: Cardiology

## 2012-01-23 VITALS — BP 134/70 | HR 76 | Ht 71.0 in | Wt 213.0 lb

## 2012-01-23 DIAGNOSIS — I2510 Atherosclerotic heart disease of native coronary artery without angina pectoris: Secondary | ICD-10-CM

## 2012-01-23 DIAGNOSIS — Z8679 Personal history of other diseases of the circulatory system: Secondary | ICD-10-CM

## 2012-01-23 DIAGNOSIS — E7800 Pure hypercholesterolemia, unspecified: Secondary | ICD-10-CM

## 2012-01-23 DIAGNOSIS — R072 Precordial pain: Secondary | ICD-10-CM

## 2012-01-23 DIAGNOSIS — R0602 Shortness of breath: Secondary | ICD-10-CM

## 2012-01-23 DIAGNOSIS — J61 Pneumoconiosis due to asbestos and other mineral fibers: Secondary | ICD-10-CM

## 2012-01-23 NOTE — Assessment & Plan Note (Signed)
No recurrent symptoms of TIA.

## 2012-01-23 NOTE — Assessment & Plan Note (Signed)
Patient is status post coronary bypass grafting. This was performed in 2006. Abnormal stress test in 2009. The patient is actually doing quite well. He reports no recurrent chest pain. No indication for ischemia workup presently.

## 2012-01-23 NOTE — Assessment & Plan Note (Signed)
The patient reports no recurrent chest pain. However he needs an evaluation of LV function. He has developed increased shortness of breath since last year. I've ordered an echocardiogram to assess LV function as well as pulmonary artery pressures.

## 2012-01-23 NOTE — Progress Notes (Signed)
Peyton Bottoms, MD, [facility] ABIM Board Certified in Adult Cardiovascular Medicine,Internal Medicine and Critical Care Medicine    CC: Followup patient with a history of coronary artery disease.  HPI:  The patient has not been seen in the clinic for several years. He has a history of coronary artery disease and status post coronary bypass grafting in 2006. He had an ischemia workup in 2009. He reports no recurrent chest pain but has noticed increasing shortness of breath on exertion. Additionally however the patient also has underlying lung disease and asbestosis, for which she is followed very closely. The patient denies any orthopnea or PND. He reports no palpitations or syncope.  PMH: reviewed and listed in Problem List in Electronic Records (and see below) Past Medical History  Diagnosis Date  . CAD (coronary artery disease)   . Renal insufficiency   . TIA (transient ischemic attack)     questionable right middle cerebral artery event  . COPD (chronic obstructive pulmonary disease)     asbestosis  . Other and unspecified hyperlipidemia   . Nephrolithiasis   . Skin cancer of nose   . Diverticulosis 2006   Past Surgical History  Procedure Date  . Coronary artery bypass graft 2006    Allergies/SH/FHX : available in Electronic Records for review  Allergies  Allergen Reactions  . Iodinated Diagnostic Agents    History   Social History  . Marital Status: Married    Spouse Name: N/A    Number of Children: N/A  . Years of Education: N/A   Occupational History  .      Retired from [employer]   Social History Main Topics  . Smoking status: Former Smoker -- 1.0 packs/day for 25 years    Types: Cigarettes    Quit date: 08/08/2003  . Smokeless tobacco: Former Neurosurgeon    Types: Chew    Quit date: 08/08/2003  . Alcohol Use: Not on file  . Drug Use: Not on file  . Sexually Active: Not on file   Other Topics Concern  . Not on file   Social History Narrative  . No narrative  on file   Family History  Problem Relation Age of Onset  . Heart attack Father     Medications: Current Outpatient Prescriptions  Medication Sig Dispense Refill  . aspirin EC 81 MG tablet Take 81 mg by mouth daily.      . metoprolol succinate (TOPROL-XL) 50 MG 24 hr tablet Take 25 mg by mouth daily. Take with or immediately following a meal.      . omeprazole (PRILOSEC) 20 MG capsule Take 20 mg by mouth daily.      . Tamsulosin HCl (FLOMAX) 0.4 MG CAPS Take 0.4 mg by mouth daily.        ROS: No nausea or vomiting. No fever or chills.No melena or hematochezia.No bleeding.No claudication  Physical Exam: BP 134/70  Pulse 76  Ht 5\' 11"  (1.803 m)  Wt 213 lb (96.616 kg)  BMI 29.71 kg/m2 General: Neck: Lungs: Cardiac: Vascular: Skin: Physcologic:  12lead ECG: Limited bedside ECHO:N/A No images are attached to the encounter.   Assessment and Plan  ASBESTOSIS Followed at the [facility] in [place]. No mesothelioma. No significant lung disease apparently  CORONARY ATHEROSCLEROSIS NATIVE CORONARY ARTERY Patient is status post coronary bypass grafting. This was performed in 2006. Abnormal stress test in 2009. The patient is actually doing quite well. He reports no recurrent chest pain. No indication for ischemia workup presently.  PURE HYPERCHOLESTEROLEMIA Patient unable to tolerate statins. He has tried several preparations. He reports significant muscle weakness and myalgias.  TRANSIENT ISCHEMIC ATTACKS, HX OF No recurrent symptoms of TIA.  PRECORDIAL PAIN The patient reports no recurrent chest pain. However he needs an evaluation of LV function. He has developed increased shortness of breath since last year. I've ordered an echocardiogram to assess LV function as well as pulmonary artery pressures.   Patient Active Problem List  Diagnosis  . PURE HYPERCHOLESTEROLEMIA  . CAD  . CORONARY ATHEROSCLEROSIS NATIVE CORONARY ARTERY  . COPD  . ASBESTOSIS  . PRECORDIAL PAIN  . PERS  HX TOBACCO USE PRESENTING HAZARDS HEALTH  . POSTSURGICAL AORTOCORONARY BYPASS STATUS  . VENOUS INSUFFICIENCY, CHRONIC  . CHRONIC KIDNEY DISEASE STAGE III (MODERATE)  . TRANSIENT ISCHEMIC ATTACKS, HX OF

## 2012-01-23 NOTE — Assessment & Plan Note (Signed)
Patient unable to tolerate statins. He has tried several preparations. He reports significant muscle weakness and myalgias.

## 2012-01-23 NOTE — Patient Instructions (Signed)
   Echo  Our office will notify of results.   Your physician wants you to follow up in:  1 year.  You will receive a reminder letter in the mail one-two months in advance.  If you don't receive a letter, please call our office to schedule the follow up appointment

## 2012-01-23 NOTE — Assessment & Plan Note (Signed)
Followed at the [facility] in [place]. No mesothelioma. No significant lung disease apparently

## 2012-02-14 ENCOUNTER — Other Ambulatory Visit: Payer: Self-pay

## 2012-02-14 ENCOUNTER — Other Ambulatory Visit (INDEPENDENT_AMBULATORY_CARE_PROVIDER_SITE_OTHER): Payer: Medicare Other

## 2012-02-14 DIAGNOSIS — R0602 Shortness of breath: Secondary | ICD-10-CM

## 2012-02-14 DIAGNOSIS — I2510 Atherosclerotic heart disease of native coronary artery without angina pectoris: Secondary | ICD-10-CM

## 2012-02-22 ENCOUNTER — Telehealth: Payer: Self-pay | Admitting: *Deleted

## 2012-02-22 NOTE — Telephone Encounter (Signed)
Notes Recorded by Lesle Chris, LPN on 1/61/0960 at 12:20 PM Patient notified and verbalized understanding.   ------  Notes Recorded by Lesle Chris, LPN on 4/54/0981 at 10:42 AM Spoke with cleaning person at patient home, asked to call back later this afternoon

## 2012-02-22 NOTE — Telephone Encounter (Signed)
Message copied by Lesle Chris on Thu Feb 22, 2012 12:20 PM ------      Message from: Prescott Parma C      Created: Thu Feb 15, 2012 12:23 PM       NL LVF (EF 55%), no sig valve abns

## 2013-01-31 ENCOUNTER — Ambulatory Visit (INDEPENDENT_AMBULATORY_CARE_PROVIDER_SITE_OTHER): Payer: Medicare Other | Admitting: Cardiology

## 2013-01-31 ENCOUNTER — Encounter: Payer: Self-pay | Admitting: Cardiology

## 2013-01-31 VITALS — BP 123/75 | HR 75 | Ht 70.5 in | Wt 213.1 lb

## 2013-01-31 DIAGNOSIS — J61 Pneumoconiosis due to asbestos and other mineral fibers: Secondary | ICD-10-CM

## 2013-01-31 DIAGNOSIS — I2510 Atherosclerotic heart disease of native coronary artery without angina pectoris: Secondary | ICD-10-CM

## 2013-01-31 DIAGNOSIS — E782 Mixed hyperlipidemia: Secondary | ICD-10-CM

## 2013-01-31 NOTE — Patient Instructions (Addendum)

## 2013-01-31 NOTE — Progress Notes (Signed)
Clinical Summary Roger [name] is an 79 y.o.male presenting for office visit. He was last seen in June 2013 by Dr. Andee Lineman. He reports chronic decrease in stamina, no progressive shortness of breath or angina however. He is overdue to have a complete physical with Dr. Sherril Croon.  Records reviewed. He has a history of statin intolerance. He has been managed medically for multivessel CAD status post CABG. Echocardiogram in July 2013 revealed LVEF 55%, grade 1 diastolic dysfunction, mild left atrial enlargement, PA systolic pressure 44 mm mercury, no major valvular abnormalities. Previous Cardiolite in 2009 demonstrated no obvious ischemia, LVEF 56%.  ECG today shows normal sinus rhythm. Today we discussed his medications, the importance of compliance. Also touched on the possibility of ultimately considering a followup stress test, however with stable symptoms we elected to pursue observation only at this time.   Allergies  Allergen Reactions  . Iodinated Diagnostic Agents     Current Outpatient Prescriptions  Medication Sig Dispense Refill  . albuterol (PROVENTIL HFA;VENTOLIN HFA) 108 (90 BASE) MCG/ACT inhaler Inhale 2 puffs into the lungs every 6 (six) hours as needed for wheezing.      Marland Kitchen aspirin EC 81 MG tablet Take 81 mg by mouth daily.      . metoprolol succinate (TOPROL-XL) 50 MG 24 hr tablet Take 25 mg by mouth daily. Take with or immediately following a meal.      . omeprazole (PRILOSEC) 20 MG capsule Take 20 mg by mouth daily.      . Tamsulosin HCl (FLOMAX) 0.4 MG CAPS Take 0.4 mg by mouth daily.       No current facility-administered medications for this visit.    Past Medical History  Diagnosis Date  . Coronary atherosclerosis of native coronary artery   . Renal insufficiency   . TIA (transient ischemic attack)     Questionable right middle cerebral artery event  . COPD (chronic obstructive pulmonary disease)   . Mixed hyperlipidemia   . Nephrolithiasis   . Skin cancer of nose     . Diverticulosis 2006  . Asbestos exposure     Followed in [place]    Past Surgical History  Procedure Laterality Date  . Coronary artery bypass graft  2006    LIMA to LAD, SVG to OM, SVG to OM, SVG to PDA    Social History Roger [name] reports that he quit smoking about 9 years ago. His smoking use included Cigarettes. He has a 25 pack-year smoking history. He quit smokeless tobacco use about 9 years ago. His smokeless tobacco use included Chew. Roger [name] reports that he does not drink alcohol.  Review of Systems No palpitations, dizziness, syncope. No claudication. No reported bleeding episodes.  Physical Examination Filed Vitals:   01/31/13 1333  BP: 123/75  Pulse: 75   Filed Weights   01/31/13 1333  Weight: 213 lb 1.9 oz (96.671 kg)   Overweight male, comfortable at rest. HEENT: Conjunctiva and lids normal, oropharynx clear. Neck: Supple, no elevated JVP or carotid bruits, no thyromegaly. Lungs: Course but clear to auscultation, nonlabored breathing at rest. Cardiac: Regular rate and rhythm, no S3, 2/6 systolic murmur, no pericardial rub. Abdomen: Soft, nontender, bowel sounds present. Extremities: No pitting edema, distal pulses 2+. Skin: Warm and dry. Musculoskeletal: No kyphosis. Neuropsychiatric: Alert and oriented x3, affect grossly appropriate.   Problem List and Plan   CORONARY ATHEROSCLEROSIS NATIVE CORONARY ARTERY Symptomatically stable on medical therapy status post CABG as outlined above. Plan to continue medications and observation  for now.  Mixed hyperlipidemia History of statin intolerance. I did recommend that he followup with Dr. Sherril Croon for routine physical and lab work.  Asbestosis(501) No history of mesothelioma based on his report. He is followed in [place].    Jonelle Sidle, M.D., F.A.C.C.

## 2013-01-31 NOTE — Assessment & Plan Note (Signed)
No history of mesothelioma based on his report. He is followed in [place].

## 2013-01-31 NOTE — Assessment & Plan Note (Signed)
Symptomatically stable on medical therapy status post CABG as outlined above. Plan to continue medications and observation for now.

## 2013-01-31 NOTE — Assessment & Plan Note (Signed)
History of statin intolerance. I did recommend that he followup with Dr. Sherril Croon for routine physical and lab work.

## 2014-02-09 ENCOUNTER — Inpatient Hospital Stay (HOSPITAL_COMMUNITY)
Admission: EM | Admit: 2014-02-09 | Discharge: 2014-02-13 | DRG: 246 | Disposition: A | Discharge status: Home / Self Care | Payer: Medicare Other | Source: Other Acute Inpatient Hospital | Attending: Cardiovascular Disease | Admitting: Cardiovascular Disease

## 2014-02-09 ENCOUNTER — Encounter (HOSPITAL_COMMUNITY): Payer: Self-pay | Admitting: Critical Care Medicine

## 2014-02-09 ENCOUNTER — Inpatient Hospital Stay (HOSPITAL_COMMUNITY): Payer: Medicare Other | Admitting: Critical Care Medicine

## 2014-02-09 ENCOUNTER — Encounter (HOSPITAL_COMMUNITY): Payer: Medicare Other | Admitting: Critical Care Medicine

## 2014-02-09 ENCOUNTER — Encounter (HOSPITAL_COMMUNITY)
Admission: EM | Disposition: A | Discharge status: Home / Self Care | Payer: Medicare Other | Source: Other Acute Inpatient Hospital | Attending: Cardiovascular Disease

## 2014-02-09 ENCOUNTER — Inpatient Hospital Stay (HOSPITAL_COMMUNITY): Payer: Medicare Other

## 2014-02-09 DIAGNOSIS — Z85828 Personal history of other malignant neoplasm of skin: Secondary | ICD-10-CM

## 2014-02-09 DIAGNOSIS — Z87891 Personal history of nicotine dependence: Secondary | ICD-10-CM

## 2014-02-09 DIAGNOSIS — I469 Cardiac arrest, cause unspecified: Secondary | ICD-10-CM

## 2014-02-09 DIAGNOSIS — J9600 Acute respiratory failure, unspecified whether with hypoxia or hypercapnia: Secondary | ICD-10-CM

## 2014-02-09 DIAGNOSIS — Z87442 Personal history of urinary calculi: Secondary | ICD-10-CM | POA: Diagnosis not present

## 2014-02-09 DIAGNOSIS — Z79899 Other long term (current) drug therapy: Secondary | ICD-10-CM | POA: Diagnosis not present

## 2014-02-09 DIAGNOSIS — I471 Supraventricular tachycardia, unspecified: Secondary | ICD-10-CM | POA: Diagnosis not present

## 2014-02-09 DIAGNOSIS — J4489 Other specified chronic obstructive pulmonary disease: Secondary | ICD-10-CM | POA: Diagnosis present

## 2014-02-09 DIAGNOSIS — I25810 Atherosclerosis of coronary artery bypass graft(s) without angina pectoris: Secondary | ICD-10-CM

## 2014-02-09 DIAGNOSIS — N183 Chronic kidney disease, stage 3 unspecified: Secondary | ICD-10-CM | POA: Diagnosis present

## 2014-02-09 DIAGNOSIS — I4719 Other supraventricular tachycardia: Secondary | ICD-10-CM

## 2014-02-09 DIAGNOSIS — J449 Chronic obstructive pulmonary disease, unspecified: Secondary | ICD-10-CM | POA: Diagnosis present

## 2014-02-09 DIAGNOSIS — I369 Nonrheumatic tricuspid valve disorder, unspecified: Secondary | ICD-10-CM

## 2014-02-09 DIAGNOSIS — Z8673 Personal history of transient ischemic attack (TIA), and cerebral infarction without residual deficits: Secondary | ICD-10-CM | POA: Diagnosis not present

## 2014-02-09 DIAGNOSIS — I2582 Chronic total occlusion of coronary artery: Secondary | ICD-10-CM | POA: Diagnosis present

## 2014-02-09 DIAGNOSIS — E875 Hyperkalemia: Secondary | ICD-10-CM | POA: Diagnosis present

## 2014-02-09 DIAGNOSIS — Y9210 Unspecified residential institution as the place of occurrence of the external cause: Secondary | ICD-10-CM | POA: Diagnosis not present

## 2014-02-09 DIAGNOSIS — K5730 Diverticulosis of large intestine without perforation or abscess without bleeding: Secondary | ICD-10-CM | POA: Diagnosis present

## 2014-02-09 DIAGNOSIS — D539 Nutritional anemia, unspecified: Secondary | ICD-10-CM | POA: Diagnosis present

## 2014-02-09 DIAGNOSIS — I4891 Unspecified atrial fibrillation: Secondary | ICD-10-CM | POA: Diagnosis not present

## 2014-02-09 DIAGNOSIS — I255 Ischemic cardiomyopathy: Secondary | ICD-10-CM

## 2014-02-09 DIAGNOSIS — I2119 ST elevation (STEMI) myocardial infarction involving other coronary artery of inferior wall: Secondary | ICD-10-CM | POA: Diagnosis present

## 2014-02-09 DIAGNOSIS — Z77090 Contact with and (suspected) exposure to asbestos: Secondary | ICD-10-CM | POA: Diagnosis not present

## 2014-02-09 DIAGNOSIS — Y831 Surgical operation with implant of artificial internal device as the cause of abnormal reaction of the patient, or of later complication, without mention of misadventure at the time of the procedure: Secondary | ICD-10-CM | POA: Diagnosis not present

## 2014-02-09 DIAGNOSIS — I498 Other specified cardiac arrhythmias: Secondary | ICD-10-CM | POA: Diagnosis not present

## 2014-02-09 DIAGNOSIS — Z8249 Family history of ischemic heart disease and other diseases of the circulatory system: Secondary | ICD-10-CM

## 2014-02-09 DIAGNOSIS — Z888 Allergy status to other drugs, medicaments and biological substances status: Secondary | ICD-10-CM

## 2014-02-09 DIAGNOSIS — J9601 Acute respiratory failure with hypoxia: Secondary | ICD-10-CM

## 2014-02-09 DIAGNOSIS — Z7982 Long term (current) use of aspirin: Secondary | ICD-10-CM | POA: Diagnosis not present

## 2014-02-09 DIAGNOSIS — J61 Pneumoconiosis due to asbestos and other mineral fibers: Secondary | ICD-10-CM | POA: Diagnosis present

## 2014-02-09 DIAGNOSIS — E782 Mixed hyperlipidemia: Secondary | ICD-10-CM | POA: Diagnosis present

## 2014-02-09 DIAGNOSIS — I959 Hypotension, unspecified: Secondary | ICD-10-CM | POA: Diagnosis present

## 2014-02-09 DIAGNOSIS — I480 Paroxysmal atrial fibrillation: Secondary | ICD-10-CM

## 2014-02-09 DIAGNOSIS — I2510 Atherosclerotic heart disease of native coronary artery without angina pectoris: Secondary | ICD-10-CM | POA: Diagnosis present

## 2014-02-09 DIAGNOSIS — R079 Chest pain, unspecified: Secondary | ICD-10-CM | POA: Diagnosis present

## 2014-02-09 DIAGNOSIS — I2589 Other forms of chronic ischemic heart disease: Secondary | ICD-10-CM | POA: Diagnosis present

## 2014-02-09 DIAGNOSIS — I519 Heart disease, unspecified: Secondary | ICD-10-CM | POA: Diagnosis not present

## 2014-02-09 DIAGNOSIS — Z91041 Radiographic dye allergy status: Secondary | ICD-10-CM

## 2014-02-09 DIAGNOSIS — Z7902 Long term (current) use of antithrombotics/antiplatelets: Secondary | ICD-10-CM | POA: Diagnosis not present

## 2014-02-09 HISTORY — PX: PERCUTANEOUS CORONARY STENT INTERVENTION (PCI-S): SHX5485

## 2014-02-09 HISTORY — PX: TEMPORARY PACEMAKER INSERTION: SHX5471

## 2014-02-09 HISTORY — PX: LEFT HEART CATHETERIZATION WITH CORONARY ANGIOGRAM: SHX5451

## 2014-02-09 LAB — BLOOD GAS, ARTERIAL
Acid-base deficit: 3.2 mmol/L — ABNORMAL HIGH (ref 0.0–2.0)
Bicarbonate: 21.5 mEq/L (ref 20.0–24.0)
Drawn by: 27553
FIO2: 100 %
O2 SAT: 98.9 %
PATIENT TEMPERATURE: 98.6
PEEP: 5 cmH2O
RATE: 14 resp/min
TCO2: 22.7 mmol/L (ref 0–100)
VT: 600 mL
pCO2 arterial: 39.9 mmHg (ref 35.0–45.0)
pH, Arterial: 7.35 (ref 7.350–7.450)
pO2, Arterial: 153 mmHg — ABNORMAL HIGH (ref 80.0–100.0)

## 2014-02-09 LAB — POCT ACTIVATED CLOTTING TIME: ACTIVATED CLOTTING TIME: 416 s

## 2014-02-09 LAB — TROPONIN I: TROPONIN I: 13.27 ng/mL — AB (ref <0.30)

## 2014-02-09 LAB — TSH: TSH: 2.16 u[IU]/mL (ref 0.350–4.500)

## 2014-02-09 LAB — LIPID PANEL
Cholesterol: 176 mg/dL (ref 0–200)
HDL: 44 mg/dL (ref >39)
LDL CALC: 112 mg/dL — AB (ref 0–99)
Total CHOL/HDL Ratio: 4 RATIO
Triglycerides: 100 mg/dL (ref <150)
VLDL: 20 mg/dL (ref 0–40)

## 2014-02-09 LAB — CK TOTAL AND CKMB (NOT AT ARMC)
CK TOTAL: 57 U/L (ref 7–232)
CK, MB: 1.9 ng/mL (ref 0.3–4.0)
RELATIVE INDEX: INVALID (ref 0.0–2.5)

## 2014-02-09 LAB — GLUCOSE, CAPILLARY: Glucose-Capillary: 155 mg/dL — ABNORMAL HIGH (ref 70–99)

## 2014-02-09 LAB — HEMOGLOBIN A1C
Hgb A1c MFr Bld: 6.3 % — ABNORMAL HIGH (ref <5.7)
Mean Plasma Glucose: 134 mg/dL — ABNORMAL HIGH (ref <117)

## 2014-02-09 LAB — MRSA PCR SCREENING: MRSA BY PCR: NEGATIVE

## 2014-02-09 LAB — PRO B NATRIURETIC PEPTIDE: Pro B Natriuretic peptide (BNP): 357.6 pg/mL (ref 0–450)

## 2014-02-09 SURGERY — LEFT HEART CATHETERIZATION WITH CORONARY ANGIOGRAM
Anesthesia: LOCAL | Laterality: Left

## 2014-02-09 MED ORDER — FENTANYL CITRATE 0.05 MG/ML IJ SOLN: 50.0000 ug | Freq: Once | INTRAMUSCULAR | Status: DC

## 2014-02-09 MED ORDER — SODIUM CHLORIDE 0.9 % IV SOLN
0.2500 mg/kg/h | INTRAVENOUS | Status: AC
  Filled 2014-02-09: qty 250

## 2014-02-09 MED ORDER — CHLORHEXIDINE GLUCONATE 0.12 % MT SOLN
15.0000 mL | Freq: Two times a day (BID) | OROMUCOSAL | Status: DC
  Administered 2014-02-09 – 2014-02-10 (×2): 15 mL via OROMUCOSAL
  Filled 2014-02-09 (×2): qty 15

## 2014-02-09 MED ORDER — PANTOPRAZOLE SODIUM 40 MG PO TBEC: 40.0000 mg | DELAYED_RELEASE_TABLET | Freq: Every day | ORAL | Status: DC

## 2014-02-09 MED ORDER — FENTANYL BOLUS VIA INFUSION
25.0000 ug | INTRAVENOUS | Status: DC | PRN
  Filled 2014-02-09: qty 50

## 2014-02-09 MED ORDER — NITROGLYCERIN 0.4 MG SL SUBL: 0.4000 mg | SUBLINGUAL_TABLET | SUBLINGUAL | Status: DC | PRN

## 2014-02-09 MED ORDER — ALPRAZOLAM 0.25 MG PO TABS: 0.2500 mg | ORAL_TABLET | Freq: Two times a day (BID) | ORAL | Status: DC | PRN

## 2014-02-09 MED ORDER — SODIUM CHLORIDE 0.9 % IV SOLN
3.0000 g | Freq: Three times a day (TID) | INTRAVENOUS | Status: DC
  Administered 2014-02-09 – 2014-02-10 (×3): 3 g via INTRAVENOUS
  Filled 2014-02-09 (×5): qty 3

## 2014-02-09 MED ORDER — INSULIN ASPART 100 UNIT/ML ~~LOC~~ SOLN
2.0000 [IU] | SUBCUTANEOUS | Status: DC
  Administered 2014-02-09: 4 [IU] via SUBCUTANEOUS

## 2014-02-09 MED ORDER — SODIUM CHLORIDE 0.9 % IJ SOLN: 3.0000 mL | INTRAMUSCULAR | Status: DC | PRN

## 2014-02-09 MED ORDER — TICAGRELOR 90 MG PO TABS
90.0000 mg | ORAL_TABLET | Freq: Two times a day (BID) | ORAL | Status: DC
  Administered 2014-02-09 – 2014-02-13 (×8): 90 mg via ORAL
  Filled 2014-02-09 (×9): qty 1

## 2014-02-09 MED ORDER — ONDANSETRON HCL 4 MG/2ML IJ SOLN: 4.0000 mg | Freq: Four times a day (QID) | INTRAMUSCULAR | Status: DC | PRN

## 2014-02-09 MED ORDER — PANTOPRAZOLE SODIUM 40 MG IV SOLR
40.0000 mg | Freq: Every day | INTRAVENOUS | Status: DC
  Administered 2014-02-09: 40 mg via INTRAVENOUS
  Filled 2014-02-09 (×2): qty 40

## 2014-02-09 MED ORDER — ENOXAPARIN SODIUM 40 MG/0.4ML ~~LOC~~ SOLN
40.0000 mg | SUBCUTANEOUS | Status: DC
  Administered 2014-02-10 – 2014-02-11 (×2): 40 mg via SUBCUTANEOUS
  Filled 2014-02-09 (×3): qty 0.4

## 2014-02-09 MED ORDER — SODIUM CHLORIDE 0.9 % IJ SOLN
3.0000 mL | Freq: Two times a day (BID) | INTRAMUSCULAR | Status: DC
  Administered 2014-02-09: 3 mL via INTRAVENOUS

## 2014-02-09 MED ORDER — SODIUM CHLORIDE 0.9 % IJ SOLN
3.0000 mL | Freq: Two times a day (BID) | INTRAMUSCULAR | Status: DC
  Administered 2014-02-10 – 2014-02-11 (×2): 3 mL via INTRAVENOUS

## 2014-02-09 MED ORDER — ASPIRIN 81 MG PO CHEW
81.0000 mg | CHEWABLE_TABLET | Freq: Every day | ORAL | Status: DC
  Administered 2014-02-10 – 2014-02-13 (×4): 81 mg via ORAL
  Filled 2014-02-09 (×4): qty 1

## 2014-02-09 MED ORDER — SODIUM CHLORIDE 0.9 % IV SOLN
1.0000 mL/kg/h | INTRAVENOUS | Status: AC
  Administered 2014-02-09: 1 mL/kg/h via INTRAVENOUS

## 2014-02-09 MED ORDER — INSULIN ASPART 100 UNIT/ML ~~LOC~~ SOLN
2.0000 [IU] | Freq: Three times a day (TID) | SUBCUTANEOUS | Status: DC
  Administered 2014-02-10: 4 [IU] via SUBCUTANEOUS
  Administered 2014-02-10 – 2014-02-11 (×4): 2 [IU] via SUBCUTANEOUS
  Administered 2014-02-12: 6 [IU] via SUBCUTANEOUS

## 2014-02-09 MED ORDER — FENTANYL CITRATE 0.05 MG/ML IJ SOLN
12.5000 ug | INTRAMUSCULAR | Status: DC | PRN
  Administered 2014-02-10: 12.5 ug via INTRAVENOUS
  Filled 2014-02-09: qty 2

## 2014-02-09 MED ORDER — SODIUM CHLORIDE 0.9 % IV SOLN
0.0000 mg/h | INTRAVENOUS | Status: DC
  Filled 2014-02-09: qty 10

## 2014-02-09 MED ORDER — MIDAZOLAM BOLUS VIA INFUSION
1.0000 mg | INTRAVENOUS | Status: DC | PRN
  Filled 2014-02-09: qty 2

## 2014-02-09 MED ORDER — TAMSULOSIN HCL 0.4 MG PO CAPS
0.4000 mg | ORAL_CAPSULE | Freq: Every day | ORAL | Status: DC
  Administered 2014-02-10 – 2014-02-13 (×3): 0.4 mg via ORAL
  Filled 2014-02-09 (×4): qty 1

## 2014-02-09 MED ORDER — METOPROLOL TARTRATE 25 MG/10 ML ORAL SUSPENSION
12.5000 mg | Freq: Two times a day (BID) | ORAL | Status: DC
  Administered 2014-02-09: 12.5 mg via ORAL
  Filled 2014-02-09 (×3): qty 5

## 2014-02-09 MED ORDER — ACETAMINOPHEN 325 MG PO TABS: 650.0000 mg | ORAL_TABLET | ORAL | Status: DC | PRN

## 2014-02-09 MED ORDER — ALBUTEROL SULFATE (2.5 MG/3ML) 0.083% IN NEBU: 3.0000 mL | INHALATION_SOLUTION | Freq: Four times a day (QID) | RESPIRATORY_TRACT | Status: DC | PRN

## 2014-02-09 MED ORDER — SODIUM CHLORIDE 0.9 % IV SOLN
0.0000 ug/h | INTRAVENOUS | Status: DC
  Filled 2014-02-09: qty 50

## 2014-02-09 MED ORDER — FENTANYL CITRATE 0.05 MG/ML IJ SOLN: 50.0000 ug | INTRAMUSCULAR | Status: DC | PRN

## 2014-02-09 MED ORDER — ACETAMINOPHEN 325 MG PO TABS
650.0000 mg | ORAL_TABLET | ORAL | Status: DC | PRN
  Administered 2014-02-10: 650 mg via ORAL
  Filled 2014-02-09: qty 2

## 2014-02-09 MED ORDER — BIOTENE DRY MOUTH MT LIQD
15.0000 mL | Freq: Two times a day (BID) | OROMUCOSAL | Status: DC
  Administered 2014-02-09: 15 mL via OROMUCOSAL

## 2014-02-09 MED ORDER — TICAGRELOR 90 MG PO TABS
180.0000 mg | ORAL_TABLET | Freq: Once | ORAL | Status: AC
  Administered 2014-02-09: 180 mg via ORAL
  Filled 2014-02-09: qty 2

## 2014-02-09 MED ORDER — SODIUM CHLORIDE 0.9 % IV SOLN: 250.0000 mL | INTRAVENOUS | Status: DC | PRN

## 2014-02-09 MED ORDER — METOPROLOL SUCCINATE ER 25 MG PO TB24: 25.0000 mg | ORAL_TABLET | Freq: Every day | ORAL | Status: DC

## 2014-02-09 MED ORDER — MIDAZOLAM HCL 2 MG/2ML IJ SOLN: 1.0000 mg | INTRAMUSCULAR | Status: DC | PRN

## 2014-02-09 MED ORDER — ATROPINE SULFATE 0.1 MG/ML IJ SOLN
INTRAMUSCULAR | Status: AC
  Filled 2014-02-09: qty 10

## 2014-02-09 MED ORDER — ASPIRIN EC 81 MG PO TBEC: 81.0000 mg | DELAYED_RELEASE_TABLET | Freq: Every day | ORAL | Status: DC

## 2014-02-09 MED ORDER — ZOLPIDEM TARTRATE 5 MG PO TABS: 5.0000 mg | ORAL_TABLET | Freq: Every evening | ORAL | Status: DC | PRN

## 2014-02-09 MED ORDER — ONDANSETRON HCL 4 MG/2ML IJ SOLN
4.0000 mg | Freq: Four times a day (QID) | INTRAMUSCULAR | Status: DC | PRN
  Administered 2014-02-12: 4 mg via INTRAVENOUS
  Filled 2014-02-09: qty 2

## 2014-02-09 NOTE — H&P (Signed)
History and Physical   Patient ID: Mariana Goytia MRN: [id number], DOB/AGE: 1928/05/23 80 y.o. Date of Encounter: 02/09/2014  Primary Physician: Glenda Chroman., MD Primary Cardiologist: Dr. Domenic Polite  Chief Complaint:  Inferior STEMI  HPI: Yordan Martindale is a 80 y.o. male with a history of CAD s/p 4 vessel CABG 2006. He was last seen by Dr. Domenic Polite in June of 2014 and was doing well.  Recently, Mr. Dinovo has been having episodes of chest pain/indigestion. These have not been exertional and have been relieved with TUMS.  Today, he woke about 6 AM with his usual indigestion feeling. It was a 5/10. He may have been a little short of breath with this but admits that his respiratory status at baseline is poor and is not sure it was any worse today. He was nauseated and sweaty. Since he thought was indigestion, he took Pepto-Bismol and TUMS with no relief. He went to [facility]. He was still having discomfort and his ECG was consistent with an inferior STEMI. He was given sublingual nitroglycerin, heparin, Zofran, Brilinta, and a 500 cc fluid bolus. He was transported emergently to [facility] and taken directly to the Cath Lab. Upon arrival to the cath lab he was still nauseated and had vomiting. His chest pain is currently a 3/10.   Past Medical History  Diagnosis Date  . Coronary atherosclerosis of native coronary artery   . Renal insufficiency   . TIA (transient ischemic attack)     Questionable right middle cerebral artery event  . COPD (chronic obstructive pulmonary disease)   . Mixed hyperlipidemia   . Nephrolithiasis   . Skin cancer of nose   . Diverticulosis 2006  . Asbestos exposure     Followed in [place]    Surgical History:  Past Surgical History  Procedure Laterality Date  . Coronary artery bypass graft  2006    LIMA to LAD, SVG to OM, SVG to OM, SVG to PDA     I have reviewed the patient's current medications. Prior to Admission medications     Medication Sig Start Date End Date Taking? Authorizing Provider  albuterol (PROVENTIL HFA;VENTOLIN HFA) 108 (90 BASE) MCG/ACT inhaler Inhale 2 puffs into the lungs every 6 (six) hours as needed for wheezing.    Historical Provider, MD  aspirin EC 81 MG tablet Take 81 mg by mouth daily.    Historical Provider, MD  metoprolol succinate (TOPROL-XL) 50 MG 24 hr tablet Take 25 mg by mouth daily. Take with or immediately following a meal.    Historical Provider, MD  omeprazole (PRILOSEC) 20 MG capsule Take 20 mg by mouth daily.    Historical Provider, MD  Tamsulosin HCl (FLOMAX) 0.4 MG CAPS Take 0.4 mg by mouth daily.    Historical Provider, MD   Allergies:  Allergies  Allergen Reactions  . Iodinated Diagnostic Agents   . Statins     Has tried several, muscle weakness and myalgias    History   Social History  . Marital Status: Married    Spouse Name: N/A    Number of Children: N/A  . Years of Education: N/A   Occupational History  .      Retired from [employer]  . Smoking status: Former Smoker -- 1.00 packs/day for 25 years    Types: Cigarettes    Quit date: 08/08/2003  . Smokeless tobacco: Former Systems developer    Types: Loss adjuster, chartered  Quit date: 08/08/2003  . Alcohol Use: No  . Drug Use: No  . Sexual Activity: Not on file   Other Topics Concern  . Not on file   Social History Narrative   Lives with family    Family History  Problem Relation Age of Onset  . Heart attack Father    Family Status  Relation Status Death Age  . Father Deceased     Died in 58's    Review of Systems:   Full 14-point review of systems otherwise negative except as noted above.  Physical Exam: Height 5' 10.5" (1.791 m), weight 211 lb 10.3 oz (96 kg). General: Well developed, well nourished,male elderly Head: Normocephalic, atraumatic, sclera non-icteric, no xanthomas, nares are without discharge. Dentition: poor Neck: No carotid bruits. JVD not elevated. No  thyromegally Lungs: Good expansion bilaterally. without wheezes or rhonchi.  Heart: Regular rate and rhythm with S1 S2.  No S3 or S4.  No murmur, no rubs, or gallops appreciated. Abdomen: Soft, non-tender, non-distended with normoactive bowel sounds. No hepatomegaly. No rebound/guarding. No obvious abdominal masses. Msk:  Strength and tone appear normal for age. No joint deformities or effusions, no spine or costo-vertebral angle tenderness. Extremities: No clubbing or cyanosis. No edema.  Distal pedal pulses are 2+ in upper extrem decreased in both lower extremities, femoral pulses and DP pulses bilaterally. No femoral bruits appreciated Neuro: Alert and oriented X 3. Moves all extremities spontaneously. No focal deficits noted. Psych:  Responds to questions appropriately with a normal affect. Skin: No rashes or lesions noted  Labs: performed at [facility] Creatinine 1.63, initial troponin 0.01, otherwise no significant abnormalities   Radiology/Studies: pending   ECG: sinus rhythm, rate 61. Inferior ST elevation noted  ASSESSMENT AND PLAN:  Principal Problem:   ST elevation myocardial infarction (STEMI) of inferior wall, initial episode of care - Patient is being taken directly to the Cath Lab with further evaluation and treatment depending on the results. We will screen for cardiac risk factors and their control. He will be continued on his home medications and we will check a chest x-ray.  Otherwise, continue home medications Active Problems:   Mixed hyperlipidemia   COPD   CHRONIC KIDNEY DISEASE STAGE III (MODERATE)   Signed, Rosaria Ferries, PA-C 02/09/2014 12:53 PM Beeper [phone]  Patient seen, examined. Available data reviewed. Agree with findings, assessment, and plan as outlined by Rosaria Ferries, PA-C. 80 year-old gentleman with acute inferior STEMI and ongoing ischemic sx's. He is brought directly from [facility] to the cardiac cath lab for emergency cath  and possible PCI. Exam reveals an elderly male in moderate distress, somewhat stoic. Lungs with coarse BS, heart distant and regular without murmur or gallop. No peripheral edema. Pedal pulses diminished, fem pulses palpable 1+ bilaterally. EKG with NSR and inferior ST elevation c/w acute infarct. Plan emergency cath +/- PCI. I have reviewed his old cath films and CABG op report. Otherwise as outlined above by Rosaria Ferries, PA-C. Changes made above where appropriate.   Sherren Mocha, M.D. 02/09/2014 1:24 PM

## 2014-02-09 NOTE — Procedures (Signed)
Extubation Procedure Note  Patient Details:   Name: Wetzel Meester DOB: 16-Dec-1927 MRN: [id number]   Airway Documentation:     Evaluation  O2 sats: stable throughout Complications: No apparent complications Patient did tolerate procedure well. Bilateral Breath Sounds: Diminished Suctioning: Oral;Airway Yes  Patient extubated to [place]. No complications. Vital signs stable. RN at bedside. RT will continue to monitor.  Mcneil Sober 02/09/2014, 5:32 PM

## 2014-02-09 NOTE — Progress Notes (Signed)
Arterial and venus sheathes removes. Manual pressure held for 30 minutes. Patient tolerated well. Pressure dressing applied to sheath site. Patient was educated about keeping his leg straight for six hours and he verbalized understanding.

## 2014-02-09 NOTE — Progress Notes (Signed)
Patient extubated by RT at 1700. Post extubation pt voices no complaints. VS including oxygen saturation remain stable. He has been placed on 2L of supplemental O2 via nasal canula. Will continue to monitor.   Georgann Housekeeper, ACNP [facility] Pulmonology/Critical Care Pager [phone] or [phone]

## 2014-02-09 NOTE — Progress Notes (Signed)
ANTIBIOTIC CONSULT NOTE - INITIAL  Pharmacy Consult for Unasyn Indication: aspiration pneumonia  Allergies  Allergen Reactions  . Iodinated Diagnostic Agents Other (See Comments)    Causes intermediate diaphoresis and hypotension  . Statins Other (See Comments)    Has tried several, muscle weakness and myalgias    Patient Measurements: Height: 5' 10.5" (179.1 cm) Weight: 211 lb 10.3 oz (96 kg) (estimated) IBW/kg (Calculated) : 74.15  Microbiology: Recent Results (from the past 720 hour(s))  MRSA PCR SCREENING     Status: None   Collection Time    02/09/14  1:46 PM      Result Value Ref Range Status   MRSA by PCR NEGATIVE  NEGATIVE Final   Comment:            The GeneXpert MRSA Assay (FDA     approved for NASAL specimens     only), is one component of a     comprehensive MRSA colonization     surveillance program. It is not     intended to diagnose MRSA     infection nor to guide or     monitor treatment for     MRSA infections.    Medical History: Past Medical History  Diagnosis Date  . Coronary atherosclerosis of native coronary artery   . Renal insufficiency   . TIA (transient ischemic attack)     Questionable right middle cerebral artery event  . COPD (chronic obstructive pulmonary disease)   . Mixed hyperlipidemia   . Nephrolithiasis   . Skin cancer of nose   . Diverticulosis 2006  . Asbestos exposure     Followed in [place]   Assessment:   Transferred to [facility] from [facility] today for urgent cardiac cath. Vomited in Cath Lab upon arrival. Unasyn to begin for aspiration pneumonia coverage.  Scr 1.63 at [facility]. Estimated crcl ~ 45 ml/min.  Goal of Therapy:  appropriate Unasyn dose for renal function and infection  Plan:   Unasyn 3 grams IV q8hrs.  Will follow renal function, culture data and clinical progress.  Arty Baumgartner, [place] Pager: [phone] 02/09/2014,3:51 PM

## 2014-02-09 NOTE — CV Procedure (Signed)
Cardiac Catheterization Procedure Note  Name: Roger [name] MRN: [id number] DOB: [birth year]  Procedure: Left Heart Cath, Selective Coronary Angiography,  saphenous vein graft angiography, LIMA angiography, PTCA/Stent of the saphenous vein graft to PDA, temporary transvenous pacemaker placement under fluoroscopic guidance.  Indication: 80 year old gentleman with coronary artery disease status post CABG in 2006. He presents with an acute inferior wall MI and is brought emergently to the cardiac catheterization lab.  Diagnostic Procedure Details: The right groin was prepped, draped, and anesthetized with 1% lidocaine. Using the modified Seldinger technique, a 6 French sheath was introduced into the right femoral artery. Standard Judkins catheters were used for selective coronary angiography, LIMA angiography, and SVG angiography. Left ventriculography was deferred because of chronic kidney disease. Catheter exchanges were performed over a wire.  The diagnostic procedure was well-tolerated without immediate complications.  PROCEDURAL FINDINGS Hemodynamics: AO 102/62 LV 104/19  Coronary angiography: Coronary dominance: right  Left mainstem: The left main is patent without obstructive disease. It divides into the LAD and left circumflex.  Left anterior descending (LAD): The LAD is moderately calcified. The vessel is severely diseased at the level of the second septal perforator with 90% stenosis. The LAD fills competitively beyond that area. The first diagonal has 80% ostial stenosis.  Left circumflex (LCx): The AV circumflex is totally occluded after the atrial branch.  Right coronary artery (RCA): The RCA is severely diseased and it is totally occluded in its proximal portion.  Saphenous vein graft sequential to OM1 and OM 2 is patent. There is hypodense 80% stenosis in the proximal body of the graft. There is an eccentric 80% stenosis in the distal body of the graft. The distal  insertion site to the second OM appears widely patent. The first OM is poorly visualized and appears to be a very small diffusely diseased vessel.  Saphenous vein graft to RCA: The vessel is totally occluded with TIMI 0 flow beyond the mid body the graft.  LIMA to LAD: The LIMA graft is widely patent to the LAD which fills almost entirely from graft flow.  Left ventriculography: Deferred because of chronic kidney disease.  PCI Procedure Note:  Following the diagnostic procedure, the decision was made to proceed with PCI. The sheath was upsized to a 6 [place]. Weight-based bivalirudin was given for anticoagulation. The patient had been loaded with brilinta 180 mg at [facility]. Once a therapeutic ACT was achieved, a 6 [place] RCB guide catheter was inserted.  A cougar coronary guidewire was used to cross the lesion.  The lesion was predilated with a 2.5 mm balloon.  Multiple balloon inflations were performed. TIMI-3 flow was established after balloon inflations. There is severe diffuse disease throughout the distal body of the graft. Aspiration thrombectomy was performed, but it would not pass the mid body of the graft where there was a lesion present. There is an ovoid filling defect in that area. The first stent was deployed over the distal body of the graft. A 3.0 x 20 mm Promus premier DES was deployed at 12 atmospheres. A second 3.0 x 20 mm Promus premier DES was deployed at 12 atmospheres over the mid body lesion. During this time, the patient developed marked bradycardia and hypotension. He was treated with atropine. Unfortunately he deteriorated and had a bradycardic arrest. CPR was performed and the cath lab. A temporary transvenous pacemaker was emergently placed after obtaining femoral venous access with a 6 French sheath. Anesthesia was called and the patient was intubated. He stabilized  with temporary pacing. At that point angiography was performed again and demonstrated essentially no flow  in the vessel. Aspiration thrombectomy was performed beyond the distal stent edge of the second stent and this reestablished TIMI grade 3 flow. It was clear there was a distal lesion present and a third stent was deployed in overlapping fashion. A 3.0 x 24 mm Promus premier DES was deployed at 10 atmospheres. At the completion of the procedure, there was TIMI-3 flow into the PDA. There was retrograde filling of the PLA branches.  PCI Data: Vessel - SVG/Segment - PDA Percent Stenosis (pre)  100 TIMI-flow 0 Stent 3.0x20 mm Promus, 3.0x20 mm Promus, and 3.0x24 mm Promus DES Percent Stenosis (post) 0 TIMI-flow (post) 3  Final Conclusions:   1. Severe native three-vessel coronary artery disease with total occlusion of the left circumflex and right coronary arteries and severe stenosis of the LAD 2. Status post aortocoronary bypass surgery with continued patency of the LIMA to LAD, severe stenosis of the saphenous vein graft obtuse marginals, and total occlusion of the saphenous vein graft to right PDA 3. Bradycardic cardiac arrest stabilized after CPR for greater than 2 minutes and temporary transvenous pacemaker placement 4. Successful primary PCI of the saphenous vein graft to right PDA using multiple drug-eluting stents  Recommendations: Long-term dual antiplatelet therapy, ICU support, guarded prognosis in this elderly gentleman.  Sherren Mocha 02/09/2014, 1:41 PM

## 2014-02-09 NOTE — Anesthesia Postprocedure Evaluation (Signed)
  Anesthesia Post-op Note  Patient: Roger [name]  Procedure(s) Performed: Procedure(s): LEFT HEART CATHETERIZATION WITH CORONARY ANGIOGRAM (Left)  Patient Location: Cath Lab  Anesthesia Type:General  Level of Consciousness: Patient remains intubated per anesthesia plan  Airway and Oxygen Therapy: Patient re-intubated and Patient remains intubated per anesthesia plan  Post-op Pain: none  Post-op Assessment: Post-op Vital signs reviewed, PATIENT'S CARDIOVASCULAR STATUS UNSTABLE and RESPIRATORY FUNCTION UNSTABLE  Post-op Vital Signs: unstable  Last Vitals: There were no vitals filed for this visit.  Complications: No apparent anesthesia complications

## 2014-02-09 NOTE — Anesthesia Preprocedure Evaluation (Signed)
Anesthesia Evaluation  Patient identified by MRN, date of birth, ID band Patient awake    Reviewed: Allergy & Precautions, H&P , NPO status   Airway       Dental   Pulmonary COPDformer smoker,          Cardiovascular + CAD and + Past MI     Neuro/Psych TIA   GI/Hepatic   Endo/Other    Renal/GU Renal disease     Musculoskeletal   Abdominal   Peds  Hematology   Anesthesia Other Findings   Reproductive/Obstetrics                           Anesthesia Physical Anesthesia Plan  ASA: IV  Anesthesia Plan: General   Post-op Pain Management:    Induction: Intravenous  Airway Management Planned: Oral ETT  Additional Equipment:   Intra-op Plan:   Post-operative Plan: Post-operative intubation/ventilation  Informed Consent:   Plan Discussed with:   Anesthesia Plan Comments:         Anesthesia Quick Evaluation

## 2014-02-09 NOTE — Progress Notes (Signed)
eLink Physician-Brief Progress Note Patient Name: Roger [name] DOB: [birth year] MRN: [id number]  Date of Service  02/09/2014   HPI/Events of Note   Chart reviewed Intubated for brief cardiac arrest earlier today Has been on pressure support ventilation for > 1 hour, breathing comfortably Follows commands, lifts head from pillow   eICU Interventions  Extubate now Bedside LB PCCM NP available as needed   Intervention Category Major Interventions: Airway management  MC[employer], Cloud 02/09/2014, 4:45 PM

## 2014-02-09 NOTE — Progress Notes (Signed)
250 mls fentanyl gtt and 35 mls versed gtt wasted in sink.

## 2014-02-09 NOTE — Code Documentation (Signed)
  Patient Name: Roger [name]   MRN: [id number]   Date of Birth/ Sex: Sep 18, 1927 , male      Admission Date: 02/09/2014  Attending Provider: Sherren Mocha, MD  Primary Diagnosis: <principal problem not specified>   Indication: Pt was in his usual state of health until this PM, when he was noted to be hypoxic respiratory arrest. Code blue was subsequently called. At the time of arrival on scene, ACLS protocol was underway.   Technical Description:  - CPR performance duration:  2  minutes  - Was defibrillation or cardioversion used? No   - Was external pacer placed? No  - Was patient intubated pre/post CPR? Yes   Medications Administered: Y = Yes; Blank = No Amiodarone    Atropine  Y  Calcium    Epinephrine    Lidocaine    Magnesium    Norepinephrine    Phenylephrine    Sodium bicarbonate    Vasopressin     Post CPR evaluation:  - Final Status - Was patient successfully resuscitated ? Yes - What is current rhythm? Sinus bradycardia - What is current hemodynamic status? Poor perfusion  Miscellaneous Information:  - Labs sent, including: None  - Primary team notified?  Yes  - Family Notified? Yes  - Additional notes/ transfer status: PCCM arrived, took over care.     Albin Felling, MD  02/09/2014, 12:34 PM

## 2014-02-09 NOTE — Progress Notes (Signed)
Post-PCI note:  Pt looks great. Surprisingly, he is already extubated. Denies chest pain or shortness of breath. Daughter at bedside. Right groin is stable except for some mild oozing. Rhythm now stable in sinus.  Plan: d/c fem arterial sheath and d/c temp pacer/venous sheath. Post-MI med Rx. Will follow clinical course and consider staged PCI of the SVG-OM if his renal function remains stable. Would not do this before Wednesday.  Sherren Mocha, MD 02/09/2014 5:42 PM

## 2014-02-09 NOTE — Consult Note (Signed)
PULMONARY / CRITICAL CARE MEDICINE   Name: Roger [name] MRN: [id number] DOB: 1927/08/14    ADMISSION DATE:  02/09/2014 CONSULTATION DATE: 7/6  REFERRING MD :  Cards PRIMARY SERVICE: Cards  CHIEF COMPLAINT: Chest pain  BRIEF PATIENT DESCRIPTION:   80 yo with hx of cad, cabg 2006, who is followed by [facility] cardiology and presented to [facility] via [facility] as a STEMI patient. He was on cath table when at 12:34 pm he developed bradycardic arrest and required 2 minutes of CPR. He has ROSC following placement of temp pacer. He required intubation via anesthesia for poor respiratory effort and placed on ventilator. PCCM asked to assist with his management.  SIGNIFICANT EVENTS / STUDIES:  7/6 post brady arrest  LINES / TUBES: 7/6 OTT>> 7/6 rt fem V line with pacer>> 7/6 rt fem A line>>  CULTURES: 7/6 sputum>>  ANTIBIOTICS: 7/6 unasyn>>  HISTORY OF PRESENT ILLNESS:   80 yo with hx of cad, cabg 2006, who is followed by [facility] cardiology and presented to [facility] via [facility] as a STEMI patient. He was on cath table when at 12:34 pm he developed bradycardic arrest and required 2 minutes of CPR. He has ROSC following placement of temp pacer. He required intubation via anesthesia for poor respiratory effort and placed on ventilator. PCCM asked to assist with his management.   PAST MEDICAL HISTORY :  Past Medical History  Diagnosis Date  . Coronary atherosclerosis of native coronary artery   . Renal insufficiency   . TIA (transient ischemic attack)     Questionable right middle cerebral artery event  . COPD (chronic obstructive pulmonary disease)   . Mixed hyperlipidemia   . Nephrolithiasis   . Skin cancer of nose   . Diverticulosis 2006  . Asbestos exposure     Followed in [place]   Past Surgical History  Procedure Laterality Date  . Coronary artery bypass graft  2006    LIMA to LAD, SVG to OM, SVG to OM, SVG to PDA   Prior to Admission medications    Medication Sig Start Date End Date Taking? Authorizing Provider  albuterol (PROVENTIL HFA;VENTOLIN HFA) 108 (90 BASE) MCG/ACT inhaler Inhale 2 puffs into the lungs every 6 (six) hours as needed for wheezing.    Historical Provider, MD  aspirin EC 81 MG tablet Take 81 mg by mouth daily.    Historical Provider, MD  metoprolol succinate (TOPROL-XL) 50 MG 24 hr tablet Take 25 mg by mouth daily. Take with or immediately following a meal.    Historical Provider, MD  omeprazole (PRILOSEC) 20 MG capsule Take 20 mg by mouth daily.    Historical Provider, MD  Tamsulosin HCl (FLOMAX) 0.4 MG CAPS Take 0.4 mg by mouth daily.    Historical Provider, MD   Allergies  Allergen Reactions  . Iodinated Diagnostic Agents   . Statins     Has tried several, muscle weakness and myalgias    FAMILY HISTORY:  Family History  Problem Relation Age of Onset  . Heart attack Father    SOCIAL HISTORY:  reports that he quit smoking about 10 years ago. His smoking use included Cigarettes. He has a 25 pack-year smoking history. He quit smokeless tobacco use about 10 years ago. His smokeless tobacco use included Chew. He reports that he does not drink alcohol or use illicit drugs.  REVIEW OF SYSTEMS: Na  SUBJECTIVE:   VITAL SIGNS: Weight:  [211 lb 10.3 oz (96 kg)] 211 lb 10.3 oz (96  kg) (07/06 1100) HEMODYNAMICS:   VENTILATOR SETTINGS:   INTAKE / OUTPUT: Intake/Output   None     PHYSICAL EXAMINATION: General: Elderly [name] male Neuro:  Prior to intubation followed commands HEENT:  Ott-> vent Cardiovascular:  HSD HSIR Lungs:  Poor air excursion Abdomen:  Soft + bs Musculoskeletal: Intact Skin: warm  LABS:  CBC No results found for this basename: WBC, HGB, HCT, PLT,  in the last 168 hours Coag's No results found for this basename: APTT, INR,  in the last 168 hours BMET No results found for this basename: NA, K, CL, CO2, BUN, CREATININE, GLUCOSE,  in the last 168 hours Electrolytes No results found  for this basename: CALCIUM, MG, PHOS,  in the last 168 hours Sepsis Markers No results found for this basename: LATICACIDVEN, PROCALCITON, O2SATVEN,  in the last 168 hours ABG No results found for this basename: PHART, PCO2ART, PO2ART,  in the last 168 hours Liver Enzymes No results found for this basename: AST, ALT, ALKPHOS, BILITOT, ALBUMIN,  in the last 168 hours Cardiac Enzymes  Recent Labs Lab 02/09/14 1150  TROPONINI <0.30   Glucose No results found for this basename: GLUCAP,  in the last 168 hours  Imaging No results found.   CXR:   ASSESSMENT / PLAN:  PULMONARY A: Post cardiac arrest with intubation and presume aspiration.  P:   Intubation MVS with WUA Empiric abx for aspiration  CARDIOVASCULAR A:  Post bradycardiac arrest CAD with SG blockage with stent pda P:  Per Cards  RENAL A:   No hx of rena; dz P:   Check creatine post cath  GASTROINTESTINAL A:   GI protection P:   PPI  HEMATOLOGIC A:  No acute issue P:    INFECTIOUS A:   Presumed aspiration P:   See flows  ENDOCRINE A:   No acute issue   P:     NEUROLOGIC A:  Followed commands  Post arrest P:   RASS goal: 1 Sedation for tube tolerance   TODAY'S SUMMARY:  80 yo with hx of cad, cabg 2006, who is followed by [facility] cardiology and presented to [facility] via [facility] as a STEMI patient. He was on cath table when at 12:34 pm he developed bradycardic arrest and required 2 minutes of CPR. He has ROSC following placement of temp pacer. He required intubation via anesthesia for poor respiratory effort and placed on ventilator. PCCM asked to assist with his management.   I have personally obtained a history, examined the patient, evaluated laboratory and imaging results, formulated the assessment and plan and placed orders.  CRITICAL CARE: The patient is critically ill with multiple organ systems failure and requires high complexity decision making for assessment and  support, frequent evaluation and titration of therapies, application of advanced monitoring technologies and extensive interpretation of multiple databases. Critical Care Time devoted to patient care services described in this note is 45 minutes.   Rush Farmer, M.D. [facility] Pulmonary/Critical Care Medicine. Pager: [phone]. After hours pager: [phone].  02/09/2014, 1:05 PM

## 2014-02-09 NOTE — Transfer of Care (Signed)
Immediate Anesthesia Transfer of Care Note  Patient: Roger [name]  Procedure(s) Performed: * No procedures listed *  Patient Location: Cath Lab  Anesthesia Type:General  Level of Consciousness: Patient remains intubated per anesthesia plan  Airway & Oxygen Therapy: Patient placed on Ventilator (see vital sign flow sheet for setting)  Post-op Assessment: Post -op Vital signs reviewed and stable  Post vital signs: Reviewed and stable  Complications: No apparent anesthesia complications

## 2014-02-09 NOTE — Progress Notes (Signed)
  Echocardiogram 2D Echocardiogram has been performed.  Male Iafrate 02/09/2014, 3:41 PM

## 2014-02-10 ENCOUNTER — Inpatient Hospital Stay (HOSPITAL_COMMUNITY): Payer: Medicare Other

## 2014-02-10 DIAGNOSIS — I519 Heart disease, unspecified: Secondary | ICD-10-CM | POA: Diagnosis not present

## 2014-02-10 DIAGNOSIS — I469 Cardiac arrest, cause unspecified: Secondary | ICD-10-CM | POA: Diagnosis not present

## 2014-02-10 DIAGNOSIS — I2119 ST elevation (STEMI) myocardial infarction involving other coronary artery of inferior wall: Secondary | ICD-10-CM | POA: Diagnosis not present

## 2014-02-10 DIAGNOSIS — J9600 Acute respiratory failure, unspecified whether with hypoxia or hypercapnia: Secondary | ICD-10-CM | POA: Diagnosis not present

## 2014-02-10 LAB — TROPONIN I: Troponin I: >20 ng/mL (ref <0.30)

## 2014-02-10 LAB — COMPREHENSIVE METABOLIC PANEL
ALT: 25 U/L (ref 0–53)
ANION GAP: 14 (ref 5–15)
AST: 106 U/L — AB (ref 0–37)
Albumin: 3.4 g/dL — ABNORMAL LOW (ref 3.5–5.2)
Alkaline Phosphatase: 63 U/L (ref 39–117)
BUN: 17 mg/dL (ref 6–23)
CALCIUM: 9.2 mg/dL (ref 8.4–10.5)
CO2: 23 mEq/L (ref 19–32)
CREATININE: 1.58 mg/dL — AB (ref 0.50–1.35)
Chloride: 101 mEq/L (ref 96–112)
GFR calc Af Amer: 45 mL/min — ABNORMAL LOW (ref >90)
GFR calc non Af Amer: 38 mL/min — ABNORMAL LOW (ref >90)
Glucose, Bld: 197 mg/dL — ABNORMAL HIGH (ref 70–99)
Potassium: 5.2 mEq/L (ref 3.7–5.3)
Sodium: 138 mEq/L (ref 137–147)
TOTAL PROTEIN: 6.6 g/dL (ref 6.0–8.3)
Total Bilirubin: 0.9 mg/dL (ref 0.3–1.2)

## 2014-02-10 LAB — LIPID PANEL
Cholesterol: 174 mg/dL (ref 0–200)
HDL: 46 mg/dL (ref >39)
LDL Cholesterol: 107 mg/dL — ABNORMAL HIGH (ref 0–99)
Total CHOL/HDL Ratio: 3.8 RATIO
Triglycerides: 104 mg/dL (ref <150)
VLDL: 21 mg/dL (ref 0–40)

## 2014-02-10 LAB — BLOOD GAS, ARTERIAL
Acid-base deficit: 3.4 mmol/L — ABNORMAL HIGH (ref 0.0–2.0)
Bicarbonate: 20.8 mEq/L (ref 20.0–24.0)
DRAWN BY: 317771
O2 CONTENT: 2 L/min
O2 Saturation: 94.8 %
PCO2 ART: 35.2 mmHg (ref 35.0–45.0)
Patient temperature: 98.6
TCO2: 21.8 mmol/L (ref 0–100)
pH, Arterial: 7.389 (ref 7.350–7.450)
pO2, Arterial: 75.5 mmHg — ABNORMAL LOW (ref 80.0–100.0)

## 2014-02-10 LAB — PHOSPHORUS: Phosphorus: 2.8 mg/dL (ref 2.3–4.6)

## 2014-02-10 LAB — CBC
HEMATOCRIT: 35.5 % — AB (ref 39.0–52.0)
HEMOGLOBIN: 12 g/dL — AB (ref 13.0–17.0)
MCH: 35.7 pg — ABNORMAL HIGH (ref 26.0–34.0)
MCHC: 33.8 g/dL (ref 30.0–36.0)
MCV: 105.7 fL — AB (ref 78.0–100.0)
Platelets: 184 10*3/uL (ref 150–400)
RBC: 3.36 MIL/uL — AB (ref 4.22–5.81)
RDW: 13.6 % (ref 11.5–15.5)
WBC: 13.8 10*3/uL — AB (ref 4.0–10.5)

## 2014-02-10 LAB — GLUCOSE, CAPILLARY
GLUCOSE-CAPILLARY: 146 mg/dL — AB (ref 70–99)
Glucose-Capillary: 173 mg/dL — ABNORMAL HIGH (ref 70–99)
Glucose-Capillary: 168 mg/dL — ABNORMAL HIGH (ref 70–99)
Glucose-Capillary: 119 mg/dL — ABNORMAL HIGH (ref 70–99)
Glucose-Capillary: 130 mg/dL — ABNORMAL HIGH (ref 70–99)

## 2014-02-10 LAB — MAGNESIUM: Magnesium: 1.8 mg/dL (ref 1.5–2.5)

## 2014-02-10 MED ORDER — METOPROLOL TARTRATE 12.5 MG HALF TABLET
12.5000 mg | ORAL_TABLET | Freq: Two times a day (BID) | ORAL | Status: DC
  Administered 2014-02-10 – 2014-02-13 (×7): 12.5 mg via ORAL
  Filled 2014-02-10 (×8): qty 1

## 2014-02-10 MED ORDER — ACETAMINOPHEN-CODEINE #3 300-30 MG PO TABS
1.0000 | ORAL_TABLET | ORAL | Status: DC | PRN
  Administered 2014-02-10 – 2014-02-11 (×5): 1 via ORAL
  Filled 2014-02-10: qty 1
  Filled 2014-02-10: qty 2
  Filled 2014-02-10: qty 1
  Filled 2014-02-10: qty 2
  Filled 2014-02-10 (×2): qty 1

## 2014-02-10 MED ORDER — RISAQUAD PO CAPS
1.0000 | ORAL_CAPSULE | Freq: Every day | ORAL | Status: DC
  Administered 2014-02-10 – 2014-02-13 (×4): 1 via ORAL
  Filled 2014-02-10 (×4): qty 1

## 2014-02-10 MED ORDER — SODIUM CHLORIDE 0.9 % IV SOLN
INTRAVENOUS | Status: DC
  Administered 2014-02-10: 12:00:00 via INTRAVENOUS

## 2014-02-10 MED ORDER — ATORVASTATIN CALCIUM 10 MG PO TABS
10.0000 mg | ORAL_TABLET | Freq: Every day | ORAL | Status: DC
  Administered 2014-02-10 – 2014-02-12 (×3): 10 mg via ORAL
  Filled 2014-02-10 (×4): qty 1

## 2014-02-10 MED ORDER — PANTOPRAZOLE SODIUM 40 MG PO TBEC
40.0000 mg | DELAYED_RELEASE_TABLET | Freq: Every day | ORAL | Status: DC
  Administered 2014-02-10 – 2014-02-13 (×4): 40 mg via ORAL
  Filled 2014-02-10 (×4): qty 1

## 2014-02-10 MED FILL — Verapamil HCl IV Soln 2.5 MG/ML: INTRAVENOUS | Qty: 2 | Status: AC

## 2014-02-10 MED FILL — Epinephrine HCl Soln Prefilled Syringe 0.1 MG/ML: INTRAMUSCULAR | Qty: 10 | Status: AC

## 2014-02-10 MED FILL — Midazolam HCl Inj 2 MG/2ML (Base Equivalent): INTRAMUSCULAR | Qty: 2 | Status: AC

## 2014-02-10 MED FILL — Methylprednisolone Sod Succ For Inj 125 MG (Base Equiv): INTRAMUSCULAR | Qty: 1 | Status: AC

## 2014-02-10 MED FILL — Diphenhydramine HCl Inj 50 MG/ML: INTRAMUSCULAR | Qty: 1 | Status: AC

## 2014-02-10 MED FILL — Atropine Sulfate Inj 0.1 MG/ML: INTRAMUSCULAR | Qty: 10 | Status: AC

## 2014-02-10 MED FILL — Bivalirudin Trifluoroacetate For IV Soln 250 MG (Base Equiv): INTRAVENOUS | Qty: 250 | Status: AC

## 2014-02-10 MED FILL — Nitroglycerin IV Soln 200 MCG/ML in D5W: INTRAVENOUS | Qty: 1 | Status: AC

## 2014-02-10 MED FILL — Ticagrelor Tab 90 MG: ORAL | Qty: 2 | Status: AC

## 2014-02-10 MED FILL — Heparin Sodium (Porcine) 2 Unit/ML in Sodium Chloride 0.9%: INTRAMUSCULAR | Qty: 1500 | Status: AC

## 2014-02-10 MED FILL — Fentanyl Citrate Inj 0.05 MG/ML: INTRAMUSCULAR | Qty: 2 | Status: AC

## 2014-02-10 MED FILL — Sodium Chloride IV Soln 0.9%: INTRAVENOUS | Qty: 50 | Status: AC

## 2014-02-10 MED FILL — Lidocaine HCl Local Preservative Free (PF) Inj 1%: INTRAMUSCULAR | Qty: 30 | Status: AC

## 2014-02-10 NOTE — Progress Notes (Signed)
    Subjective:  Denies dyspnea; some chest pain with movements related to CPR. No symptoms similar to infarct pain.   Objective:  Filed Vitals:   02/10/14 0500 02/10/14 0600 02/10/14 0742 02/10/14 0800  BP: 133/69 126/61 122/66 133/59  Pulse: 80 65 66 72  Temp:   97.4 F (36.3 C)   TempSrc:   Oral   Resp: 13 13 13 12   Height:      Weight:      SpO2: 100% 100% 99% 100%    Intake/Output from previous day:  Intake/Output Summary (Last 24 hours) at 02/10/14 4034 Last data filed at 02/10/14 0800  Gross per 24 hour  Intake 2237.25 ml  Output    905 ml  Net 1332.25 ml    Physical Exam: Physical exam: Well-developed well-nourished in no acute distress.  Skin is warm and dry.  HEENT is normal.  Neck is supple.  Chest is clear to auscultation with normal expansion.  Cardiovascular exam is regular rate and rhythm.  Abdominal exam nontender or distended. No masses palpated. Extremities show no edema. Right groin with no hematoma and no bruit neuro grossly intact    Lab Results: Basic Metabolic Panel:  Recent Labs  02/10/14 0200  NA 138  K 5.2  CL 101  CO2 23  GLUCOSE 197*  BUN 17  CREATININE 1.58*  CALCIUM 9.2  MG 1.8  PHOS 2.8   CBC:  Recent Labs  02/10/14 0200  WBC 13.8*  HGB 12.0*  HCT 35.5*  MCV 105.7*  PLT 184   Cardiac Enzymes:  Recent Labs  02/09/14 1150 02/09/14 1603 02/09/14 1933 02/10/14 0153  CKTOTAL 57  --   --   --   CKMB 1.9  --   --   --   TROPONINI <0.30 13.27* >20.00* >20.00*     Assessment/Plan:  1 status post myocardial infarction-continue aspirin, brilinta, and metoprolol. He apparently has had some difficulties with Lipitor previously. He is willing to try 10 mg daily to see if he tolerates. 2 residual disease in saphenous vein graft to the obtuse marginal-continue medications as outlined above. Plan staged PCI and renal function stable. Question tomorrow or Thursday. 3 chronic stage III renal failure-follow renal  function closely given recent dye load and transient hypotension with cardiac arrest. 4 paroxysmal atrial fibrillation-the patient had transient atrial fibrillation last evening but now is in sinus. This may be related to the stress of his infarct. Continue aspirin and metoprolol. If he has recurrences he will require long-term anticoagulation. I will not add at this point. 5 COPD 6 aspiration-antibiotics were added empirically for possible aspiration. Will review with critical care medicine. 7 question dye allergy-patient states it made him feel warm all over previously. He tolerated procedure yesterday. Will not prophylax prior to PCI of his saphenous vein graft to obtuse marginal. 8 macrocytic anemia-check B12 and folate.  Roger [name] 02/10/2014, 9:09 AM

## 2014-02-10 NOTE — Progress Notes (Signed)
Nutrition Brief Note  Patient identified on the Malnutrition Screening Tool (MST) Report. Pt reported on admission that he was unsure if he had weight loss. Per weights below, weights have been relatively stable.  Wt Readings from Last 15 Encounters:  02/10/14 205 lb (92.987 kg)  02/10/14 205 lb (92.987 kg)  01/31/13 213 lb 1.9 oz (96.671 kg)  01/23/12 213 lb (96.616 kg)  10/06/10 213 lb (96.616 kg)    Body mass index is 28.99 kg/(m^2). Patient meets criteria for Overweight based on current BMI.   Current diet order is Heart Healthy, patient is consuming approximately 100% of meals at this time. Labs and medications reviewed.   No nutrition interventions warranted at this time. If nutrition issues arise, please consult RD.   Inda Coke MS, RD, LDN Inpatient Registered Dietitian Pager: [phone] After-hours pager: [phone]

## 2014-02-10 NOTE — Progress Notes (Signed)
CARDIAC REHAB PHASE I   PRE:  Rate/Rhythm: 72 SR  BP:  Supine: 126/48  Sitting:   Standing:    SaO2: 95 RA  MODE:  Ambulation: 700 ft   POST:  Rate/Rhythm: 100 SR  BP:  Supine:   Sitting: 143/66  Standing:    SaO2: 97 RA 1030-1135 Pt tolerated ambulation well without c/o of chest pain or SOB. VS stable Pt back to bed after walk. Started MI and stent education with pt, wife and daughter. They voice understanding. We discussed diabetic heart healthy diet and I gave pt diet guidelines. We will continue to follow pt.  Rodney Langton RN 02/10/2014 11:33 AM

## 2014-02-10 NOTE — Consult Note (Signed)
PULMONARY / CRITICAL CARE MEDICINE   Name: Roger [name] MRN: [id number] DOB: 1928/04/18    ADMISSION DATE:  02/09/2014 CONSULTATION DATE: 7/6  REFERRING MD :  Cards PRIMARY SERVICE: Cards  CHIEF COMPLAINT: Chest pain  BRIEF PATIENT DESCRIPTION:   80 yo with hx of cad, cabg 2006, who is followed by [facility] cardiology and presented to [facility] via [facility] as a STEMI patient. He was on cath table when at 12:34 pm he developed bradycardic arrest and required 2 minutes of CPR. He has ROSC following placement of temp pacer. He required intubation via anesthesia for poor respiratory effort and placed on ventilator. PCCM asked to assist with his management.   SIGNIFICANT EVENTS / STUDIES:  7/6 post brady arrest 7/6- extubated 7/7- walked the icu  LINES / TUBES: 7/6 OTT>> 7/6 rt fem V line with pacer>> 7/6 rt fem A line>>out  CULTURES: 7/6 sputum>>  ANTIBIOTICS: 7/6 unasyn>>  SUBJECTIVE: walked the icu  VITAL SIGNS: Temp:  [97.4 F (36.3 C)-98 F (36.7 C)] 97.4 F (36.3 C) (07/07 0742) Pulse Rate:  [65-93] 72 (07/07 0800) Resp:  [10-24] 12 (07/07 0800) BP: (99-140)/(39-100) 133/59 mmHg (07/07 0800) SpO2:  [97 %-100 %] 100 % (07/07 0800) Arterial Line BP: (102-149)/(58-73) 135/64 mmHg (07/06 1755) FiO2 (%):  [100 %] 100 % (07/06 1300) Weight:  [92.987 kg (205 lb)] 92.987 kg (205 lb) (07/07 0400) HEMODYNAMICS:   VENTILATOR SETTINGS: Vent Mode:  [-] CPAP;PSV FiO2 (%):  [100 %] 100 % Set Rate:  [14 bmp] 14 bmp Vt Set:  [600 mL] 600 mL PEEP:  [5 cmH20] 5 cmH20 Pressure Support:  [5 cmH20] 5 cmH20 Plateau Pressure:  [10 cmH20] 10 cmH20 INTAKE / OUTPUT: Intake/Output     07/06 0701 - 07/07 0700 07/07 0701 - 07/08 0700   P.O. 480 360   I.V. (mL/kg) 1097.3 (11.8)    IV Piggyback 300    Total Intake(mL/kg) 1877.3 (20.2) 360 (3.9)   Urine (mL/kg/hr) 905    Total Output 905     Net +972.3 +360        Urine Occurrence 2 x    Stool Occurrence 1 x       PHYSICAL EXAMINATION: General: Elderly [place] male Neuro:  Nonfocal, talkative, ambulating HEENT:  jvd wnl Cardiovascular:  HSD HSIR Lungs:  Poor air excursion Abdomen:  Soft + bs, no r/g Musculoskeletal: Intact Skin: warm  LABS:  CBC  Recent Labs Lab 02/10/14 0200  WBC 13.8*  HGB 12.0*  HCT 35.5*  PLT 184   Coag's No results found for this basename: APTT, INR,  in the last 168 hours BMET  Recent Labs Lab 02/10/14 0200  NA 138  K 5.2  CL 101  CO2 23  BUN 17  CREATININE 1.58*  GLUCOSE 197*   Electrolytes  Recent Labs Lab 02/10/14 0200  CALCIUM 9.2  MG 1.8  PHOS 2.8   Sepsis Markers No results found for this basename: LATICACIDVEN, PROCALCITON, O2SATVEN,  in the last 168 hours ABG  Recent Labs Lab 02/09/14 1500 02/10/14 0441  PHART 7.350 7.389  PCO2ART 39.9 35.2  PO2ART 153.0* 75.5*   Liver Enzymes  Recent Labs Lab 02/10/14 0200  AST 106*  ALT 25  ALKPHOS 63  BILITOT 0.9  ALBUMIN 3.4*   Cardiac Enzymes  Recent Labs Lab 02/09/14 1150 02/09/14 1603 02/09/14 1933 02/10/14 0153  TROPONINI <0.30 13.27* >20.00* >20.00*  PROBNP  --  357.6  --   --    Glucose  Recent Labs Lab 02/09/14 1621 02/09/14 2142 02/10/14 0746  GLUCAP 155* 173* 168*    Imaging Dg Chest Port 1 View  02/10/2014   CLINICAL DATA:  Weakness  EXAM: PORTABLE CHEST - 1 VIEW  COMPARISON:  Yesterday  FINDINGS: Upper normal heart size. Endotracheal and NG tubes have been removed. Improved aeration and basilar atelectasis. Calcified densities throughout both lungs are stable. No pneumothorax.  IMPRESSION: Extubated.  Improved aeration.   Electronically Signed   By: Maryclare Bean M.D.   On: 02/10/2014 08:23   Dg Chest Port 1 View  02/09/2014   CLINICAL DATA:  Intubation.  EXAM: PORTABLE CHEST - 1 VIEW  COMPARISON:  Chest x-ray 11/19/2010 and 01/26/2005.  FINDINGS: Endotracheal and NG tube in stable position . Cardiomegaly with normal pulmonary vascularity. Prior CABG.  Multiple calcified pulmonary nodular densities are noted. This suggests granulomatous disease and /or old inhalational disease. Calcified pleural plaques cannot be excluded. Prior asbestos exposure cannot be excluded. Pleural parenchymal thickening noted over the left lung bases with scarring. No pneumothorax. No acute osseous abnormality.  IMPRESSION: 1. Endotracheal tube and NG tube in good anatomic position. 2. Prior CABG. Cardiomegaly. No evidence of overt congestive heart failure. 3. Multiple calcified pulmonary nodules and possible calcified pleural plaques. Granulomatous disease and prior inhalational disease could present in this fashion.   Electronically Signed   By: Marcello Moores  Register   On: 02/09/2014 16:48   Dg Abd Portable 1v  02/09/2014   CLINICAL DATA:  Vomiting status post nasogastric tube placement  EXAM: PORTABLE ABDOMEN - 1 VIEW  COMPARISON:  Abdominal film of November 20, 2010  FINDINGS: The tip of the esophagogastric tube is noted at the superior margin of the image. There is contrast within the renal collecting systems and ureters and urinary bladder. A Foley catheter is in place. There is a right femoral venous line in place whose tip projects off the superior margin of the image. A femoral arterial catheter on the right is present. The bony structures are unremarkable.  IMPRESSION: Only the tip of the NG tube is seen at the superior margin of the film. Advancement by 10-15 cm with follow-up imaging is recommended.   Electronically Signed   By: David  [place]   On: 02/09/2014 16:45     CXR: chronic int changes  ASSESSMENT / PLAN:  PULMONARY A: Post cardiac arrest with intubation and presume aspiration.  Extubated successful 7/6 Asbestosis / copd  P:   IS ambualtion Home prn albuteral  CARDIOVASCULAR A:  Post bradycardiac arrest CAD with SG blockage with stent pda P:  Per Cards, doing well  RENAL A:   No hx of rena; dz Mild hyperK At risk dye injury P:   Would re  assess in am , may need kayxlate Avoid lasix Keep positive  GASTROINTESTINAL A:   GI protection P:   PPI diet  HEMATOLOGIC A:  DVT prev P:  Lovenox, crt to follow at his age , dc if less crt clearance 30  INFECTIOUS A:  No evidence asp At risk cdiff P:   Dc unasyn, observe Start home probiotic   NEUROLOGIC A:  Followed commands  Post arrest, no enceph noted P:   PT, ambulating  Will sign off, follow crt closely   Lavon Paganini. Titus Mould, MD, [place] Pgr: [facility]

## 2014-02-11 DIAGNOSIS — J9600 Acute respiratory failure, unspecified whether with hypoxia or hypercapnia: Secondary | ICD-10-CM | POA: Diagnosis not present

## 2014-02-11 DIAGNOSIS — I2119 ST elevation (STEMI) myocardial infarction involving other coronary artery of inferior wall: Secondary | ICD-10-CM | POA: Diagnosis not present

## 2014-02-11 DIAGNOSIS — I519 Heart disease, unspecified: Secondary | ICD-10-CM | POA: Diagnosis not present

## 2014-02-11 DIAGNOSIS — I469 Cardiac arrest, cause unspecified: Secondary | ICD-10-CM | POA: Diagnosis not present

## 2014-02-11 LAB — CBC
HEMATOCRIT: 34.1 % — AB (ref 39.0–52.0)
HEMOGLOBIN: 11.3 g/dL — AB (ref 13.0–17.0)
MCH: 35.8 pg — AB (ref 26.0–34.0)
MCHC: 33.1 g/dL (ref 30.0–36.0)
MCV: 107.9 fL — AB (ref 78.0–100.0)
Platelets: 162 10*3/uL (ref 150–400)
RBC: 3.16 MIL/uL — AB (ref 4.22–5.81)
RDW: 14.1 % (ref 11.5–15.5)
WBC: 12.2 10*3/uL — ABNORMAL HIGH (ref 4.0–10.5)

## 2014-02-11 LAB — GLUCOSE, CAPILLARY
GLUCOSE-CAPILLARY: 121 mg/dL — AB (ref 70–99)
Glucose-Capillary: 132 mg/dL — ABNORMAL HIGH (ref 70–99)
Glucose-Capillary: 97 mg/dL (ref 70–99)
Glucose-Capillary: 143 mg/dL — ABNORMAL HIGH (ref 70–99)

## 2014-02-11 LAB — CULTURE, RESPIRATORY W GRAM STAIN: Special Requests: NORMAL

## 2014-02-11 LAB — BASIC METABOLIC PANEL
Anion gap: 14 (ref 5–15)
BUN: 24 mg/dL — AB (ref 6–23)
CHLORIDE: 100 meq/L (ref 96–112)
CO2: 25 meq/L (ref 19–32)
CREATININE: 1.79 mg/dL — AB (ref 0.50–1.35)
Calcium: 9.3 mg/dL (ref 8.4–10.5)
GFR calc Af Amer: 38 mL/min — ABNORMAL LOW (ref >90)
GFR calc non Af Amer: 33 mL/min — ABNORMAL LOW (ref >90)
GLUCOSE: 138 mg/dL — AB (ref 70–99)
POTASSIUM: 4.8 meq/L (ref 3.7–5.3)
Sodium: 139 mEq/L (ref 137–147)

## 2014-02-11 LAB — CULTURE, RESPIRATORY

## 2014-02-11 LAB — VITAMIN B12: Vitamin B-12: 235 pg/mL (ref 211–911)

## 2014-02-11 LAB — FOLATE RBC: RBC Folate: 1053 ng/mL — ABNORMAL HIGH (ref >280)

## 2014-02-11 MED ORDER — ACETAMINOPHEN-CODEINE #3 300-30 MG PO TABS
2.0000 | ORAL_TABLET | Freq: Once | ORAL | Status: AC
  Administered 2014-02-11: 2 via ORAL

## 2014-02-11 NOTE — Progress Notes (Addendum)
    Subjective:  Denies dyspnea; some chest pain with movements related to CPR. No symptoms similar to infarct pain.   Objective:  Filed Vitals:   02/11/14 0000 02/11/14 0400 02/11/14 0425 02/11/14 0733  BP: 115/52 130/56  126/101  Pulse:      Temp: 97.9 F (36.6 C) 97.6 F (36.4 C)  98.3 F (36.8 C)  TempSrc: Oral Oral  Oral  Resp: 12 16  18   Height:      Weight:   204 lb 6.4 oz (92.715 kg)   SpO2: 98% 100%  100%    Intake/Output from previous day:  Intake/Output Summary (Last 24 hours) at 02/11/14 0805 Last data filed at 02/11/14 0700  Gross per 24 hour  Intake   1400 ml  Output   2025 ml  Net   -625 ml    Physical Exam: Physical exam: Well-developed well-nourished in no acute distress.  Skin is warm and dry.  HEENT is normal.  Neck is supple.  Chest is clear to auscultation with normal expansion.  Cardiovascular exam is regular rate and rhythm.  Abdominal exam nontender or distended. No masses palpated. Extremities show no edema.  neuro grossly intact    Lab Results: Basic Metabolic Panel:  Recent Labs  02/10/14 0200 02/11/14 0300  NA 138 139  K 5.2 4.8  CL 101 100  CO2 23 25  GLUCOSE 197* 138*  BUN 17 24*  CREATININE 1.58* 1.79*  CALCIUM 9.2 9.3  MG 1.8  --   PHOS 2.8  --    CBC:  Recent Labs  02/10/14 0200 02/11/14 0300  WBC 13.8* 12.2*  HGB 12.0* 11.3*  HCT 35.5* 34.1*  MCV 105.7* 107.9*  PLT 184 162   Cardiac Enzymes:  Recent Labs  02/09/14 1150 02/09/14 1603 02/09/14 1933 02/10/14 0153  CKTOTAL 57  --   --   --   CKMB 1.9  --   --   --   TROPONINI <0.30 13.27* >20.00* >20.00*     Assessment/Plan:  1 status post myocardial infarction-continue aspirin, brilinta, and metoprolol. He apparently has had some difficulties with Lipitor previously. We are trying 10 mg daily to see if he tolerates. 2 residual disease in saphenous vein graft to the obtuse marginal-continue medications as outlined above. Plan staged PCI when  renal function stable. Slight increase in Cr today; recheck in AM and if stable or improving, proceed tomorrow. 3 chronic stage III renal failure-follow renal function closely given recent dye load and transient hypotension with cardiac arrest. 4 paroxysmal atrial fibrillation-the patient had transient atrial fibrillation previously but now is in sinus. This may be related to the stress of his infarct. Had brief PAT overnight. Continue aspirin and metoprolol. If he has recurrences he will require long-term anticoagulation. I will not add at this point. 5 COPD 6 aspiration-antibiotics DCed by CCM; follow; no fever. 7 question dye allergy-patient states it made him feel warm all over previously. He tolerated procedure at time of MI. Will not prophylax prior to PCI of his saphenous vein graft to obtuse marginal. 8 macrocytic anemia-check B12 and folate. Transfer to telemetry  Kirk Ruths 02/11/2014, 8:05 AM   Reviewed with Dr Burt Knack; lesion in SVG to OM not critical; if renal function not improving in AM, patient could potentially be DCed and then plan elective PCI later when renal function stable. Kirk Ruths

## 2014-02-11 NOTE — Progress Notes (Signed)
Report called to receiving RN [id number]. Will transfer via [place].

## 2014-02-11 NOTE — Progress Notes (Signed)
CARDIAC REHAB PHASE I   PRE:  Rate/Rhythm: 79 SR    BP:  (pt at door)    SaO2: 93 RA  MODE:  Ambulation: 750 ft   POST:  Rate/Rhythm: 92 SR    BP: sitting 154/80     SaO2: 96 RA  Tolerated well. Only c/o is chest soreness from compressions.  Encouraged more walking. [id number]  Josephina Shih [place] CES, ACSM 02/11/2014 11:12 AM

## 2014-02-12 ENCOUNTER — Encounter (HOSPITAL_COMMUNITY)
Admission: EM | Disposition: A | Discharge status: Home / Self Care | Payer: Medicare Other | Source: Other Acute Inpatient Hospital | Attending: Cardiovascular Disease

## 2014-02-12 ENCOUNTER — Encounter (HOSPITAL_COMMUNITY): Payer: Self-pay | Admitting: *Deleted

## 2014-02-12 ENCOUNTER — Other Ambulatory Visit: Payer: Self-pay

## 2014-02-12 DIAGNOSIS — I2119 ST elevation (STEMI) myocardial infarction involving other coronary artery of inferior wall: Secondary | ICD-10-CM | POA: Diagnosis not present

## 2014-02-12 DIAGNOSIS — D539 Nutritional anemia, unspecified: Secondary | ICD-10-CM

## 2014-02-12 DIAGNOSIS — I471 Supraventricular tachycardia: Secondary | ICD-10-CM

## 2014-02-12 DIAGNOSIS — R079 Chest pain, unspecified: Secondary | ICD-10-CM | POA: Diagnosis not present

## 2014-02-12 DIAGNOSIS — I25810 Atherosclerosis of coronary artery bypass graft(s) without angina pectoris: Secondary | ICD-10-CM

## 2014-02-12 DIAGNOSIS — I480 Paroxysmal atrial fibrillation: Secondary | ICD-10-CM

## 2014-02-12 DIAGNOSIS — I4719 Other supraventricular tachycardia: Secondary | ICD-10-CM

## 2014-02-12 DIAGNOSIS — I255 Ischemic cardiomyopathy: Secondary | ICD-10-CM

## 2014-02-12 DIAGNOSIS — I2589 Other forms of chronic ischemic heart disease: Secondary | ICD-10-CM

## 2014-02-12 HISTORY — PX: PERCUTANEOUS CORONARY STENT INTERVENTION (PCI-S): SHX5485

## 2014-02-12 LAB — BASIC METABOLIC PANEL
ANION GAP: 14 (ref 5–15)
BUN: 25 mg/dL — AB (ref 6–23)
CO2: 25 mEq/L (ref 19–32)
Calcium: 9.5 mg/dL (ref 8.4–10.5)
Chloride: 97 mEq/L (ref 96–112)
Creatinine, Ser: 1.61 mg/dL — ABNORMAL HIGH (ref 0.50–1.35)
GFR, EST AFRICAN AMERICAN: 44 mL/min — AB (ref >90)
GFR, EST NON AFRICAN AMERICAN: 38 mL/min — AB (ref >90)
Glucose, Bld: 141 mg/dL — ABNORMAL HIGH (ref 70–99)
POTASSIUM: 4.3 meq/L (ref 3.7–5.3)
Sodium: 136 mEq/L — ABNORMAL LOW (ref 137–147)

## 2014-02-12 LAB — PROTIME-INR
INR: 1.03 (ref 0.00–1.49)
Prothrombin Time: 13.5 seconds (ref 11.6–15.2)

## 2014-02-12 LAB — POCT ACTIVATED CLOTTING TIME: ACTIVATED CLOTTING TIME: 276 s

## 2014-02-12 LAB — GLUCOSE, CAPILLARY
GLUCOSE-CAPILLARY: 286 mg/dL — AB (ref 70–99)
GLUCOSE-CAPILLARY: 196 mg/dL — AB (ref 70–99)
Glucose-Capillary: 155 mg/dL — ABNORMAL HIGH (ref 70–99)

## 2014-02-12 SURGERY — PERCUTANEOUS CORONARY STENT INTERVENTION (PCI-S)
Anesthesia: LOCAL

## 2014-02-12 MED ORDER — MIDAZOLAM HCL 2 MG/2ML IJ SOLN
INTRAMUSCULAR | Status: AC
  Filled 2014-02-12: qty 2

## 2014-02-12 MED ORDER — SODIUM CHLORIDE 0.9 % IV SOLN
1.7500 mg/kg/h | INTRAVENOUS | Status: DC
Start: 2014-02-12 — End: 2014-02-12
  Filled 2014-02-12: qty 250

## 2014-02-12 MED ORDER — HEPARIN (PORCINE) IN NACL 2-0.9 UNIT/ML-% IJ SOLN
INTRAMUSCULAR | Status: AC
  Filled 2014-02-12: qty 1500

## 2014-02-12 MED ORDER — SODIUM CHLORIDE 0.9 % IV SOLN
1.0000 mL/kg/h | INTRAVENOUS | Status: AC
  Administered 2014-02-13: 1 mL/kg/h via INTRAVENOUS

## 2014-02-12 MED ORDER — METHYLPREDNISOLONE SODIUM SUCC 125 MG IJ SOLR
125.0000 mg | INTRAMUSCULAR | Status: DC
  Filled 2014-02-12: qty 2

## 2014-02-12 MED ORDER — LIDOCAINE HCL (PF) 1 % IJ SOLN
INTRAMUSCULAR | Status: AC
  Filled 2014-02-12: qty 30

## 2014-02-12 MED ORDER — BIVALIRUDIN 250 MG IV SOLR
INTRAVENOUS | Status: AC
  Filled 2014-02-12: qty 250

## 2014-02-12 MED ORDER — SODIUM CHLORIDE 0.9 % IJ SOLN
3.0000 mL | INTRAMUSCULAR | Status: DC | PRN
Start: 2014-02-12 — End: 2014-02-12

## 2014-02-12 MED ORDER — SODIUM CHLORIDE 0.9 % IV SOLN: INTRAVENOUS | Status: DC

## 2014-02-12 MED ORDER — SODIUM CHLORIDE 0.9 % IV SOLN: 250.0000 mL | INTRAVENOUS | Status: DC | PRN

## 2014-02-12 MED ORDER — FENTANYL CITRATE 0.05 MG/ML IJ SOLN
INTRAMUSCULAR | Status: AC
  Filled 2014-02-12: qty 2

## 2014-02-12 MED ORDER — NITROGLYCERIN 0.2 MG/ML ON CALL CATH LAB
INTRAVENOUS | Status: AC
  Filled 2014-02-12: qty 1

## 2014-02-12 MED ORDER — VERAPAMIL HCL 2.5 MG/ML IV SOLN
INTRAVENOUS | Status: AC
  Filled 2014-02-12: qty 2

## 2014-02-12 MED ORDER — SODIUM CHLORIDE 0.9 % IJ SOLN: 3.0000 mL | Freq: Two times a day (BID) | INTRAMUSCULAR | Status: DC

## 2014-02-12 MED ORDER — DIPHENHYDRAMINE HCL 50 MG/ML IJ SOLN
25.0000 mg | INTRAMUSCULAR | Status: DC
  Filled 2014-02-12: qty 1

## 2014-02-12 MED ORDER — FAMOTIDINE IN NACL 20-0.9 MG/50ML-% IV SOLN
20.0000 mg | INTRAVENOUS | Status: DC
  Filled 2014-02-12: qty 50

## 2014-02-12 NOTE — H&P (View-Only) (Signed)
    Subjective:  Denies dyspnea; some chest pain with movements related to CPR. No symptoms similar to infarct pain.   Objective:  Filed Vitals:   02/11/14 0000 02/11/14 0400 02/11/14 0425 02/11/14 0733  BP: 115/52 130/56  126/101  Pulse:      Temp: 97.9 F (36.6 C) 97.6 F (36.4 C)  98.3 F (36.8 C)  TempSrc: Oral Oral  Oral  Resp: 12 16  18   Height:      Weight:   204 lb 6.4 oz (92.715 kg)   SpO2: 98% 100%  100%    Intake/Output from previous day:  Intake/Output Summary (Last 24 hours) at 02/11/14 0805 Last data filed at 02/11/14 0700  Gross per 24 hour  Intake   1400 ml  Output   2025 ml  Net   -625 ml    Physical Exam: Physical exam: Well-developed well-nourished in no acute distress.  Skin is warm and dry.  HEENT is normal.  Neck is supple.  Chest is clear to auscultation with normal expansion.  Cardiovascular exam is regular rate and rhythm.  Abdominal exam nontender or distended. No masses palpated. Extremities show no edema.  neuro grossly intact    Lab Results: Basic Metabolic Panel:  Recent Labs  02/10/14 0200 02/11/14 0300  NA 138 139  K 5.2 4.8  CL 101 100  CO2 23 25  GLUCOSE 197* 138*  BUN 17 24*  CREATININE 1.58* 1.79*  CALCIUM 9.2 9.3  MG 1.8  --   PHOS 2.8  --    CBC:  Recent Labs  02/10/14 0200 02/11/14 0300  WBC 13.8* 12.2*  HGB 12.0* 11.3*  HCT 35.5* 34.1*  MCV 105.7* 107.9*  PLT 184 162   Cardiac Enzymes:  Recent Labs  02/09/14 1150 02/09/14 1603 02/09/14 1933 02/10/14 0153  CKTOTAL 57  --   --   --   CKMB 1.9  --   --   --   TROPONINI <0.30 13.27* >20.00* >20.00*     Assessment/Plan:  1 status post myocardial infarction-continue aspirin, brilinta, and metoprolol. He apparently has had some difficulties with Lipitor previously. We are trying 10 mg daily to see if he tolerates. 2 residual disease in saphenous vein graft to the obtuse marginal-continue medications as outlined above. Plan staged PCI when  renal function stable. Slight increase in Cr today; recheck in AM and if stable or improving, proceed tomorrow. 3 chronic stage III renal failure-follow renal function closely given recent dye load and transient hypotension with cardiac arrest. 4 paroxysmal atrial fibrillation-the patient had transient atrial fibrillation previously but now is in sinus. This may be related to the stress of his infarct. Had brief PAT overnight. Continue aspirin and metoprolol. If he has recurrences he will require long-term anticoagulation. I will not add at this point. 5 COPD 6 aspiration-antibiotics DCed by CCM; follow; no fever. 7 question dye allergy-patient states it made him feel warm all over previously. He tolerated procedure at time of MI. Will not prophylax prior to PCI of his saphenous vein graft to obtuse marginal. 8 macrocytic anemia-check B12 and folate. Transfer to telemetry  Roger [name] 02/11/2014, 8:05 AM   Reviewed with Dr Burt Knack; lesion in SVG to OM not critical; if renal function not improving in AM, patient could potentially be DCed and then plan elective PCI later when renal function stable. Roger [name]

## 2014-02-12 NOTE — Care Management Note (Unsigned)
    Page 1 of 1   02/13/2014     10:26:45 AM CARE MANAGEMENT NOTE 02/13/2014  Patient:  Roger [name], Roger [name]   Account Number:  [id number]  Date Initiated:  02/10/2014  Documentation initiated by:  [facility]  Subjective/Objective Assessment:   Admitted with STEMI - coded - brady arrest.     Action/Plan:   Anticipated DC Date:  02/13/2014   Anticipated DC Plan:  [place]  CM consult      Choice offered to / List presented to:             Status of service:  In process, will continue to follow Medicare Important Message given?   (If response is "NO", the following Medicare IM given date fields will be blank) Date Medicare IM given:   Medicare IM given by:   Date Additional Medicare IM given:   Additional Medicare IM given by:    Discharge Disposition:    Per UR Regulation:  Reviewed for med. necessity/level of care/duration of stay  If discussed at [place] of Stay Meetings, dates discussed:    Comments:  ---02/13/2014 1025 by Mariann Laster--- Benefits Check:  ticagrelor (BRILINTA) tablet 90 mg  :  Dose 90 mg  :  Oral :  2 times daily Copay, coverage, authorizations, deductibles, preferred pharm Patient for discharge this am. Thanks, Mariann Laster RN, BSN, [place], CCM  Nurse - Case Manager, (Unit [phone]  02/13/2014  Contact:  [place],Betty Spouse [phone]                 Cathie Olden Daughter     [phone]   02/12/14 Ellan Lambert, RN, BSN [phone] PCI today at 12N.

## 2014-02-12 NOTE — CV Procedure (Signed)
    CARDIAC CATH NOTE  Name: Roger [name] MRN: [id number] DOB: 05/03/1928  Procedure: PTCA and stenting of the SVG to OM1  Indication: 80 yo WM s/p CABG in 2006 presented recently with an inferior STEMI with occlusion of SVG to RCA. He underwent emergent stenting of this graft. Diagnostic angiogram also demonstrated 80% segmental stenosis in the SVG to the OM proximally and a focal 80% stenosis in the mid SVG. He returns for staged PCI of these lesions.   Procedural Details: The right wrist was prepped, draped, and anesthetized with 1% lidocaine. Using the modified Seldinger technique, a 6 Fr slender sheath was introduced into the radial artery. 3 mg verapamil was administered through the radial sheath. Weight-based bivalirudin was given for anticoagulation. Once a therapeutic ACT was achieved, a 6 [place] FR4 guide catheter was inserted. Guide position was very difficult due to tortuosity and angulation of the innominate artery. I was unable to engage with an LCB guide and a LA1 guide gave inadequate backup. Given difficulty with guide support I abandoned using a filter wire.  A prowater coronary guidewire was used to cross the lesion.  The lesion was predilated with a 2.5 mm balloon.  The lesion in the body of the SVG was then stented with a 3.5 x 12 mm Xience stent to 12 atm.  The proximal SVG was then stented with a 3.25 x 23 mm Xience stent deployed at 14 atm.  Following PCI, there was 0% residual stenosis and TIMI-3 flow. Final angiography confirmed an excellent result. The patient tolerated the procedure well. There were no immediate procedural complications. A TR band was used for radial hemostasis. The patient was transferred to the post catheterization recovery area for further monitoring.  Lesion Data: Vessel: SVG to OM1 Percent stenosis (pre): 80% x 2 TIMI-flow (pre):  3 Stent:  3.5 x 12 mm Xience (mid), 3.25 x 23 mm Xience (proximal) Percent stenosis (post): 0% TIMI-flow (post):  3  Conclusions: Successful stenting of the SVG to the OM 1 with DES x 2.   Recommendations: Continue DAPT for at least one year. Anticipate DC in am if renal function stable. I would recommend alternative arterial access in the future if PCI needed.   Rabab Currington [place], [place] 02/12/2014, 12:48 PM

## 2014-02-12 NOTE — Progress Notes (Signed)
CARDIAC REHAB PHASE I   PRE:  Rate/Rhythm: 83 SR    BP: sitting 110/60    SaO2:   MODE:  Ambulation: 550 ft   POST:  Rate/Rhythm: 100 ST    BP: sitting 138/70     SaO2:   Tolerated well. Slower pace today. Discussed CRPII but pt not interested. Will f/u tomorrow. [phone]   Roger [name] [place] CES, ACSM 02/12/2014 10:19 AM

## 2014-02-12 NOTE — Progress Notes (Signed)
Patient: Roger [name] / Admit Date: 02/09/2014 / Date of Encounter: 02/12/2014, 9:40 AM   Subjective: Feels good, except sore when he moves (from CPR). No SOB or acute complaints. Daughter Rod Holler is an RT at [employer] - he calls her "Dr. Rod Holler" and says she's part of the reason why he's still here.   Objective: Telemetry: NSR Physical Exam: Blood pressure 126/69, pulse 82, temperature 97.9 F (36.6 C), temperature source Oral, resp. rate 16, height 5' 10.5" (1.791 m), weight 201 lb 8 oz (91.4 kg), SpO2 100.00%. General: Well developed, well nourished WM in no acute distress. Head: Normocephalic, atraumatic, sclera non-icteric, no xanthomas, nares are without discharge. Neck: Negative for carotid bruits. JVP not elevated. Lungs: Clear bilaterally to auscultation without wheezes, rales, or rhonchi. Breathing is unlabored. Heart: RRR S1 S2 without murmurs, rubs, or gallops.  Abdomen: Soft, non-tender, non-distended with normoactive bowel sounds. No rebound/guarding. Extremities: No clubbing or cyanosis. No edema. Distal pedal pulses are 2+ and equal bilaterally. Neuro: Alert and oriented X 3. Moves all extremities spontaneously. Psych:  Responds to questions appropriately with a normal affect.   Intake/Output Summary (Last 24 hours) at 02/12/14 0940 Last data filed at 02/12/14 5102  Gross per 24 hour  Intake    720 ml  Output   1325 ml  Net   -605 ml    Inpatient Medications:  . acidophilus  1 capsule Oral Daily  . aspirin  81 mg Oral Daily  . atorvastatin  10 mg Oral q1800  . enoxaparin (LOVENOX) injection  40 mg Subcutaneous Q24H  . insulin aspart  2-6 Units Subcutaneous TID WC & HS  . metoprolol tartrate  12.5 mg Oral BID  . pantoprazole  40 mg Oral Daily  . sodium chloride  3 mL Intravenous Q12H  . sodium chloride  3 mL Intravenous Q12H  . tamsulosin  0.4 mg Oral Daily  . ticagrelor  90 mg Oral BID   Infusions:  . sodium chloride      Labs:  Recent Labs  02/10/14 0200 02/11/14 0300 02/12/14 0412  NA 138 139 136*  K 5.2 4.8 4.3  CL 101 100 97  CO2 23 25 25   GLUCOSE 197* 138* 141*  BUN 17 24* 25*  CREATININE 1.58* 1.79* 1.61*  CALCIUM 9.2 9.3 9.5  MG 1.8  --   --   PHOS 2.8  --   --     Recent Labs  02/10/14 0200  AST 106*  ALT 25  ALKPHOS 63  BILITOT 0.9  PROT 6.6  ALBUMIN 3.4*    Recent Labs  02/10/14 0200 02/11/14 0300  WBC 13.8* 12.2*  HGB 12.0* 11.3*  HCT 35.5* 34.1*  MCV 105.7* 107.9*  PLT 184 162    Recent Labs  02/09/14 1150 02/09/14 1603 02/09/14 1933 02/10/14 0153  CKTOTAL 57  --   --   --   CKMB 1.9  --   --   --   TROPONINI <0.30 13.27* >20.00* >20.00*   No components found with this basename: POCBNP,   Recent Labs  02/09/14 1603  HGBA1C 6.3*     Radiology/Studies:  Dg Chest Port 1 View  02/10/2014   CLINICAL DATA:  Weakness  EXAM: PORTABLE CHEST - 1 VIEW  COMPARISON:  Yesterday  FINDINGS: Upper normal heart size. Endotracheal and NG tubes have been removed. Improved aeration and basilar atelectasis. Calcified densities throughout both lungs are stable. No pneumothorax.  IMPRESSION: Extubated.  Improved aeration.   Electronically Signed  By: Maryclare Bean M.D.   On: 02/10/2014 08:23   Dg Chest Port 1 View  02/09/2014   CLINICAL DATA:  Intubation.  EXAM: PORTABLE CHEST - 1 VIEW  COMPARISON:  Chest x-ray 11/19/2010 and 01/26/2005.  FINDINGS: Endotracheal and NG tube in stable position . Cardiomegaly with normal pulmonary vascularity. Prior CABG. Multiple calcified pulmonary nodular densities are noted. This suggests granulomatous disease and /or old inhalational disease. Calcified pleural plaques cannot be excluded. Prior asbestos exposure cannot be excluded. Pleural parenchymal thickening noted over the left lung bases with scarring. No pneumothorax. No acute osseous abnormality.  IMPRESSION: 1. Endotracheal tube and NG tube in good anatomic position. 2. Prior CABG. Cardiomegaly. No evidence of overt  congestive heart failure. 3. Multiple calcified pulmonary nodules and possible calcified pleural plaques. Granulomatous disease and prior inhalational disease could present in this fashion.   Electronically Signed   By: Marcello Moores  Register   On: 02/09/2014 16:48   Dg Abd Portable 1v  02/09/2014   CLINICAL DATA:  Vomiting status post nasogastric tube placement  EXAM: PORTABLE ABDOMEN - 1 VIEW  COMPARISON:  Abdominal film of November 20, 2010  FINDINGS: The tip of the esophagogastric tube is noted at the superior margin of the image. There is contrast within the renal collecting systems and ureters and urinary bladder. A Foley catheter is in place. There is a right femoral venous line in place whose tip projects off the superior margin of the image. A femoral arterial catheter on the right is present. The bony structures are unremarkable.  IMPRESSION: Only the tip of the NG tube is seen at the superior margin of the film. Advancement by 10-15 cm with follow-up imaging is recommended.   Electronically Signed   By: David  [place]   On: 02/09/2014 16:45     Assessment and Plan  1. CAD (CABG 2006) with inferior STEMI - s/p PTCA/DESx3 to SVG-PDA 02/09/14 for total occlusion, patent LIMA-LAD, severe residual stenosis of SVG-OM. Given stability of renal function he has been placed on the schedule for staged PCI today. Continue aspirin, statin, Brilinta, BB. 2. Bradycardic cardiac arrest requiring temp pacemaker - resolved. Tolerating low dose BB. 3. ICM - inferobasal HK, EF 50% by echo - Will not add ACEI at this time due to cath, renal fcn. 4. Acute respiratory failure s/p intubation, presumed aspiration - abx discontinued, no fever, resolved. 5. CKD stage III, Cr stable (1.6 in 2011).  6. PAF/PAT - had transient atrial fibrillation previously but now is in sinus. PAT a few nights ago. This may be related to the stress of his infarct. Continue aspirin and metoprolol. If he has recurrences he will require long-term  anticoagulation. 7. Possible dye reaction before - per Dr. Stanford Breed made him feel warm all over, but no allergic reaction noted at time of MI. Not felt to require prophylaxis at present per prior notes. 8. Macrocytic anemia - B12 wnl, folate high. TSH wnl. No bleeding. Macroytosis chronic by 2011 labs. F/u PCP.  Signed, Melina Copa PA-C  I have seen and examined the patient along with Melina Copa PA-C.  I have reviewed the chart, notes and new data.  I agree with PA's note.  PLAN: Feels well. For staged PCI of SVG to OM today, following inferior wall MI and emergency PCI/DES of SVG to RCA  Sanda Klein, MD, [facility] [place] [phone] 02/12/2014, 2:04 PM

## 2014-02-12 NOTE — Interval H&P Note (Signed)
History and Physical Interval Note:  02/12/2014 11:23 AM  Roger [name]  has presented today for surgery, with the diagnosis of mi  The various methods of treatment have been discussed with the patient and family. After consideration of risks, benefits and other options for treatment, the patient has consented to  Procedure(s): PERCUTANEOUS CORONARY STENT INTERVENTION (PCI-S) (N/A) as a surgical intervention .  The patient's history has been reviewed, patient examined, no change in status, stable for surgery.  I have reviewed the patient's chart and labs.  Questions were answered to the patient's satisfaction.    Cath Lab Visit (complete for each Cath Lab visit)  Clinical Evaluation Leading to the Procedure:   ACS: Yes.    Non-ACS:    Anginal Classification: CCS III  Anti-ischemic medical therapy: Minimal Therapy (1 class of medications)  Non-Invasive Test Results: No non-invasive testing performed  Prior CABG: Previous CABG       Collier [facility] 02/12/2014 11:23 AM

## 2014-02-13 ENCOUNTER — Encounter (HOSPITAL_COMMUNITY): Payer: Self-pay | Admitting: Physician Assistant

## 2014-02-13 DIAGNOSIS — I519 Heart disease, unspecified: Secondary | ICD-10-CM | POA: Diagnosis not present

## 2014-02-13 DIAGNOSIS — I2510 Atherosclerotic heart disease of native coronary artery without angina pectoris: Secondary | ICD-10-CM

## 2014-02-13 DIAGNOSIS — J61 Pneumoconiosis due to asbestos and other mineral fibers: Secondary | ICD-10-CM

## 2014-02-13 DIAGNOSIS — I469 Cardiac arrest, cause unspecified: Secondary | ICD-10-CM | POA: Diagnosis not present

## 2014-02-13 DIAGNOSIS — J9600 Acute respiratory failure, unspecified whether with hypoxia or hypercapnia: Secondary | ICD-10-CM | POA: Diagnosis not present

## 2014-02-13 DIAGNOSIS — I2119 ST elevation (STEMI) myocardial infarction involving other coronary artery of inferior wall: Secondary | ICD-10-CM | POA: Diagnosis not present

## 2014-02-13 LAB — CBC
HCT: 35.1 % — ABNORMAL LOW (ref 39.0–52.0)
HEMOGLOBIN: 11.6 g/dL — AB (ref 13.0–17.0)
MCH: 35.3 pg — ABNORMAL HIGH (ref 26.0–34.0)
MCHC: 33 g/dL (ref 30.0–36.0)
MCV: 106.7 fL — ABNORMAL HIGH (ref 78.0–100.0)
PLATELETS: 206 10*3/uL (ref 150–400)
RBC: 3.29 MIL/uL — AB (ref 4.22–5.81)
RDW: 13.7 % (ref 11.5–15.5)
WBC: 12.4 10*3/uL — AB (ref 4.0–10.5)

## 2014-02-13 LAB — GLUCOSE, CAPILLARY: Glucose-Capillary: 156 mg/dL — ABNORMAL HIGH (ref 70–99)

## 2014-02-13 LAB — BASIC METABOLIC PANEL
ANION GAP: 18 — AB (ref 5–15)
BUN: 26 mg/dL — ABNORMAL HIGH (ref 6–23)
CO2: 22 mEq/L (ref 19–32)
Calcium: 9.4 mg/dL (ref 8.4–10.5)
Chloride: 97 mEq/L (ref 96–112)
Creatinine, Ser: 1.55 mg/dL — ABNORMAL HIGH (ref 0.50–1.35)
GFR, EST AFRICAN AMERICAN: 46 mL/min — AB (ref >90)
GFR, EST NON AFRICAN AMERICAN: 39 mL/min — AB (ref >90)
Glucose, Bld: 160 mg/dL — ABNORMAL HIGH (ref 70–99)
POTASSIUM: 4.5 meq/L (ref 3.7–5.3)
SODIUM: 137 meq/L (ref 137–147)

## 2014-02-13 MED ORDER — METOPROLOL TARTRATE 12.5 MG HALF TABLET: 12.5000 mg | ORAL_TABLET | Freq: Two times a day (BID) | ORAL | Status: DC

## 2014-02-13 MED ORDER — TICAGRELOR 90 MG PO TABS: 90.0000 mg | ORAL_TABLET | Freq: Two times a day (BID) | ORAL | Status: DC

## 2014-02-13 MED ORDER — TICAGRELOR 90 MG PO TABS: 90.0000 mg | ORAL_TABLET | Freq: Two times a day (BID) | ORAL | Status: AC

## 2014-02-13 MED ORDER — ATORVASTATIN CALCIUM 10 MG PO TABS: 10.0000 mg | ORAL_TABLET | Freq: Every day | ORAL | Status: AC

## 2014-02-13 MED FILL — Sodium Chloride IV Soln 0.9%: INTRAVENOUS | Qty: 50 | Status: AC

## 2014-02-13 NOTE — Progress Notes (Signed)
Patient Name: Roger [name] Date of Encounter: 02/13/2014     Principal Problem:   ST elevation myocardial infarction (STEMI) of inferior wall, initial episode of care Active Problems:   Mixed hyperlipidemia   COPD   CHRONIC KIDNEY DISEASE STAGE III (MODERATE)   Cardiac arrest   Acute respiratory failure   Cardiomyopathy, ischemic   PAF (paroxysmal atrial fibrillation)   PAT (paroxysmal atrial tachycardia)   Macrocytic anemia    SUBJECTIVE  Denies any chest discomfort or SOB. He states he unintentionally pulled out an IV line last night when rolled on bed. Per nursing, he appeared mildly confused last night.  CURRENT MEDS . acidophilus  1 capsule Oral Daily  . aspirin  81 mg Oral Daily  . atorvastatin  10 mg Oral q1800  . metoprolol tartrate  12.5 mg Oral BID  . pantoprazole  40 mg Oral Daily  . sodium chloride  3 mL Intravenous Q12H  . tamsulosin  0.4 mg Oral Daily  . ticagrelor  90 mg Oral BID    OBJECTIVE  Filed Vitals:   02/12/14 1700 02/12/14 1956 02/13/14 0018 02/13/14 0437  BP: 133/59 124/84 110/50 109/52  Pulse:  75 75 75  Temp:  97.5 F (36.4 C) 97.3 F (36.3 C) 96.6 F (35.9 C)  TempSrc:  Oral Oral Axillary  Resp:  18 18 18   Height:      Weight:   200 lb 13.4 oz (91.1 kg)   SpO2:  96% 97% 98%    Intake/Output Summary (Last 24 hours) at 02/13/14 0648 Last data filed at 02/13/14 0600  Gross per 24 hour  Intake   2135 ml  Output   1300 ml  Net    835 ml   Filed Weights   02/11/14 0425 02/12/14 0433 02/13/14 0018  Weight: 204 lb 6.4 oz (92.715 kg) 201 lb 8 oz (91.4 kg) 200 lb 13.4 oz (91.1 kg)    PHYSICAL EXAM  General: Pleasant, NAD. Neuro: Alert and oriented X 3. Moves all extremities spontaneously. Psych: Normal affect. HEENT:  Normal  Neck: Supple without bruits or JVD. Lungs:  Resp regular and unlabored, CTA. Heart: RRR no s3, s4, or 1/6 systolic murmur. Abdomen: Soft, non-tender, non-distended, BS + x 4. R groin cath site has  some bruising, small hematoma, no active bleeding.  Extremities: No clubbing, cyanosis or edema. DP/PT/Radials 2+ and equal bilaterally. R radial cath site clean, dry, intact, no significant bleeding or hematoma  Accessory Clinical Findings  CBC  Recent Labs  02/11/14 0300 02/13/14 0431  WBC 12.2* 12.4*  HGB 11.3* 11.6*  HCT 34.1* 35.1*  MCV 107.9* 106.7*  PLT 162 528   Basic Metabolic Panel  Recent Labs  02/11/14 0300 02/12/14 0412  NA 139 136*  K 4.8 4.3  CL 100 97  CO2 25 25  GLUCOSE 138* 141*  BUN 24* 25*  CREATININE 1.79* 1.61*  CALCIUM 9.3 9.5    TELE  NSR with HR 60s, no significant ventricular ectopy  ECG  NSR with HR 70, T wave inversion in inferior lead, Q wave in lead II and III  Radiology/Studies  Dg Chest Port 1 View  02/10/2014   CLINICAL DATA:  Weakness  EXAM: PORTABLE CHEST - 1 VIEW  COMPARISON:  Yesterday  FINDINGS: Upper normal heart size. Endotracheal and NG tubes have been removed. Improved aeration and basilar atelectasis. Calcified densities throughout both lungs are stable. No pneumothorax.  IMPRESSION: Extubated.  Improved aeration.   Electronically Signed  By: Maryclare Bean M.D.   On: 02/10/2014 08:23   Dg Chest Port 1 View  02/09/2014   CLINICAL DATA:  Intubation.  EXAM: PORTABLE CHEST - 1 VIEW  COMPARISON:  Chest x-ray 11/19/2010 and 01/26/2005.  FINDINGS: Endotracheal and NG tube in stable position . Cardiomegaly with normal pulmonary vascularity. Prior CABG. Multiple calcified pulmonary nodular densities are noted. This suggests granulomatous disease and /or old inhalational disease. Calcified pleural plaques cannot be excluded. Prior asbestos exposure cannot be excluded. Pleural parenchymal thickening noted over the left lung bases with scarring. No pneumothorax. No acute osseous abnormality.  IMPRESSION: 1. Endotracheal tube and NG tube in good anatomic position. 2. Prior CABG. Cardiomegaly. No evidence of overt congestive heart failure. 3.  Multiple calcified pulmonary nodules and possible calcified pleural plaques. Granulomatous disease and prior inhalational disease could present in this fashion.   Electronically Signed   By: Marcello Moores  Register   On: 02/09/2014 16:48   Dg Abd Portable 1v  02/09/2014   CLINICAL DATA:  Vomiting status post nasogastric tube placement  EXAM: PORTABLE ABDOMEN - 1 VIEW  COMPARISON:  Abdominal film of November 20, 2010  FINDINGS: The tip of the esophagogastric tube is noted at the superior margin of the image. There is contrast within the renal collecting systems and ureters and urinary bladder. A Foley catheter is in place. There is a right femoral venous line in place whose tip projects off the superior margin of the image. A femoral arterial catheter on the right is present. The bony structures are unremarkable.  IMPRESSION: Only the tip of the NG tube is seen at the superior margin of the film. Advancement by 10-15 cm with follow-up imaging is recommended.   Electronically Signed   By: David  Martinique   On: 02/09/2014 16:45    ASSESSMENT AND PLAN  1. Inferior STEMI   - Cath 02/09/14 severe 3 v disease with total occlusion of LCx and RCA and severe stenosis of LAD. LIMA to LAD patent, severe stenosis of SVG to OM, total occlusion of SVG to rPDA. In-cath bradycardia and cardiac arrest requiring CPR >2 min, intubation and temp transvenous pacemaker. DES to SVG to rPDA  - extubated 7/6, same day  - h/o dye allergy (warm feeling all over), however tolerated cath well without prophylaxis  - cath 02/12/2014 DES x 2 to SVG to OM1 (difficult R radial access due to tortuosity)  - continue ASA, statin, metoprolol, brilinta  - likely discharge today  2. PAF in the setting of acute MI  - if recur, will need long term anticoagulation per Dr. Stanford Breed 7/8  3. Aspiration: s/p abx by CCM 4. CAD s/p 4v CABG 2006 (LIMA to LAD, seq SVG to OM1 and OM2, SVG to rPDA) 5. Hyperlipidemia 6. COPD 7. Chronic kidney disease stage III: Cr  stable  Signed, Woodward Ku Pager: [phone]

## 2014-02-13 NOTE — Progress Notes (Signed)
Pt awoke twice tonight disoriented.  Reminded pt he was in hospital, pleasant and cooperative.  Pulled out iv once, reinserted without difficulty 1st attempt.  Sitting in chair drinking decaf coffee in room in front of nurses station for easy observation.  Denies pain except occasional rib soreness when turning in bed, denies desire for pain med.  Call light in reach.  Rt radial level 0, rt groin bruised w/ small hematoma, level 1.  Tele SR 70's.  BP 109/52.

## 2014-02-13 NOTE — Discharge Summary (Signed)
Discharge Summary   Patient ID: Roger [name],  MRN: [id number], DOB/AGE: 08-Apr-1928 80 y.o.  Admit date: 02/09/2014 Discharge date: 02/13/2014  Primary Care Provider: VYAS,DHRUV B. Primary Cardiologist: Dr. Domenic Polite  Discharge Diagnoses Principal Problem:   ST elevation myocardial infarction (STEMI) of inferior wall, initial episode of care Active Problems:   Mixed hyperlipidemia   COPD   CHRONIC KIDNEY DISEASE STAGE III (MODERATE)   Cardiac arrest   Acute respiratory failure   Cardiomyopathy, ischemic   PAF (paroxysmal atrial fibrillation)   PAT (paroxysmal atrial tachycardia)   Macrocytic anemia   Allergies Allergies  Allergen Reactions  . Iodinated Diagnostic Agents Other (See Comments)    Causes intermediate diaphoresis and hypotension  . Statins Other (See Comments)    Has tried several, muscle weakness and myalgias  . Plavix [Clopidogrel Bisulfate] Other (See Comments)    PER FAMILY - Patient complained of muscular myalgias when taking medication and cardiologist from around his time of CABG in 2006 took him off med, due to leg myalgia reaction. No shortness of breath, hives, rash or anything else. Patient has a Hx of leg muscle myalgias, FYI. 02/09/14    Procedures  Cardiac Catheterization Procedure Note 02/09/2014  PROCEDURAL FINDINGS  Hemodynamics:  AO 102/62  LV 104/19  Coronary angiography:  Coronary dominance: right  Left mainstem: The left main is patent without obstructive disease. It divides into the LAD and left circumflex.  Left anterior descending (LAD): The LAD is moderately calcified. The vessel is severely diseased at the level of the second septal perforator with 90% stenosis. The LAD fills competitively beyond that area. The first diagonal has 80% ostial stenosis.  Left circumflex (LCx): The AV circumflex is totally occluded after the atrial branch.  Right coronary artery (RCA): The RCA is severely diseased and it is totally occluded in its  proximal portion.  Saphenous vein graft sequential to OM1 and OM 2 is patent. There is hypodense 80% stenosis in the proximal body of the graft. There is an eccentric 80% stenosis in the distal body of the graft. The distal insertion site to the second OM appears widely patent. The first OM is poorly visualized and appears to be a very small diffusely diseased vessel.  Saphenous vein graft to RCA: The vessel is totally occluded with TIMI 0 flow beyond the mid body the graft.  LIMA to LAD: The LIMA graft is widely patent to the LAD which fills almost entirely from graft flow.  Left ventriculography: Deferred because of chronic kidney disease.  PCI Data:  Vessel - SVG/Segment - PDA  Percent Stenosis (pre) 100  TIMI-flow 0  Stent 3.0x20 mm Promus, 3.0x20 mm Promus, and 3.0x24 mm Promus DES  Percent Stenosis (post) 0  TIMI-flow (post) 3  Final Conclusions:  1. Severe native three-vessel coronary artery disease with total occlusion of the left circumflex and right coronary arteries and severe stenosis of the LAD  2. Status post aortocoronary bypass surgery with continued patency of the LIMA to LAD, severe stenosis of the saphenous vein graft obtuse marginals, and total occlusion of the saphenous vein graft to right PDA  3. Bradycardic cardiac arrest stabilized after CPR for greater than 2 minutes and temporary transvenous pacemaker placement  4. Successful primary PCI of the saphenous vein graft to right PDA using multiple drug-eluting stents  Recommendations: Long-term dual antiplatelet therapy, ICU support, guarded prognosis in this elderly gentleman.  Transthoracic Echocardiogram 02/09/2014 LV EF: 50%  ------------------------------------------------------------------- Indications: MI - acute 410.91.  ------------------------------------------------------------------- History:  PMH: Chronic obstructive pulmonary disease. Risk factors: Former tobacco  use.  ------------------------------------------------------------------- Study Conclusions  - Left ventricle: Inferobasal hypokinesis. The cavity size was normal. Wall thickness was increased in a pattern of mild LVH. The estimated ejection fraction was 50%. - Aortic valve: There was trivial regurgitation. 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 The cavity size was moderately dilated. - Right atrium: The atrium was moderately dilated. - Atrial septum: No defect or patent foramen ovale was identified. - Tricuspid valve: There was moderate regurgitation. - Pulmonary arteries: PA peak pressure: 39 mm Hg (S).    CARDIAC CATH NOTE 02/12/2014 Procedure: PTCA and stenting of the SVG to OM1 Lesion Data:  Vessel: SVG to OM1  Percent stenosis (pre): 80% x 2  TIMI-flow (pre): 3  Stent: 3.5 x 12 mm Xience (mid), 3.25 x 23 mm Xience (proximal)  Percent stenosis (post): 0%  TIMI-flow (post): 3  Conclusions: Successful stenting of the SVG to the OM 1 with DES x 2.  Recommendations: Continue DAPT for at least one year. Anticipate DC in am if renal function stable. I would recommend alternative arterial access in the future if PCI needed.    Hospital Course  Mr. Knotts is an 80 year old male with history of four-vessel bypass in 2006, chronic renal insufficiency, COPD and hyperlipidemia. Patient has shortness of breath at baseline however has been experiencing worsening shortness of breath recently. He woke up around 6 AM in the morning of 02/09/2014 with his usual indigestion feeling. He was also severely short of breath and experienced nausea and sweating. He took Pepto-Bismol and TUMS without relief. He went to [facility] where an EKG confirmed inferior STEMI. He was given sublingual nitroglycerin and placed on heparin and  transferred to [facility] for emergent cardiac catheterization.   He went through cardiac catheterization on arrival at [facility] which showed patent LIMA to LAD, occluded SVG to RCA which was treated as a drug-eluting stent, 80% blockage in proximal and distal sequential SVG to OM 1 and OM 2. During the initial cardiac catheterization, patient had a bradycardia and subsequently went into a cardiac arrest. CPR was performed in the cath lab. A temporary transvenous pacemaker was placed. Patient was intubated and transferred to the ICU. Pulmonary critical care team was consulted for further management. He was extubated on the same day without difficulty. Post cardiac arrest, patient was noted to have transient paroxysmal atrial fibrillation on telemetry however it was in the setting of his infarction. It was decided not to add any long-term systemic anticoagulation unless a-fib recur. He was given antibiotics for potential aspiration. Laboratory showed he had macrocytic anemia and it is recommended he follows up with his PCP for further workup. Patient did well recovering from bradycardic arrest. His acute respiratory failure eventually resolved and the antibiotics were discontinued as patient has been afebrile. He returned to the cath lab on 02/12/2014 to finish staged PCI which was delayed secondary to bradycardic arrest. Patient received 2 more drug-eluting stent to SVG to OM1. Of note, patient contrast dye allergy, however he had no significant reaction to contrast dye during cardiac catheterization without pretreatment. After catheterization, patient was placed on low-dose metoprolol, ASA, brillinta and statin. The dosage of metoprolol has not been able to titrate up secondary to hypotension. Although he did have an adverse reaction to  high-dose statin before, it was decided to try low dose statin to see if he can tolerate it.  Patient was seen the morning of 02/13/2014, which time he denies  any significant chest discomfort or shortness breath. Both his previous right radial cath site or right groin cath site appears to be stable without significant hematoma or bleeding. She is deemed stable for discharge from cardiac perspective. He will followup with Dr. Domenic Polite in our [place] office within a week. Patient has been educated on the importance of compliance with medical therapy. If he does re-experience any  shortness of breath, chest discomfort, or dizziness, patient should contact cardiology or go to the nearest ED for further evaluation.   Discharge Vitals Blood pressure 138/64, pulse 75, temperature 98.7 F (37.1 C), temperature source Oral, resp. rate 18, height 5' 10.5" (1.791 m), weight 200 lb 13.4 oz (91.1 kg), SpO2 97.00%.  Filed Weights   02/11/14 0425 02/12/14 0433 02/13/14 0018  Weight: 204 lb 6.4 oz (92.715 kg) 201 lb 8 oz (91.4 kg) 200 lb 13.4 oz (91.1 kg)    Labs  CBC  Recent Labs  02/11/14 0300 02/13/14 0431  WBC 12.2* 12.4*  HGB 11.3* 11.6*  HCT 34.1* 35.1*  MCV 107.9* 106.7*  PLT 162 824   Basic Metabolic Panel  Recent Labs  02/12/14 0412 02/13/14 0431  NA 136* 137  K 4.3 4.5  CL 97 97  CO2 25 22  GLUCOSE 141* 160*  BUN 25* 26*  CREATININE 1.61* 1.55*  CALCIUM 9.5 9.4    Disposition  Pt is being discharged home today in good condition.  Follow-up Plans & Appointments      Follow-up Information   Follow up with Rozann Lesches, MD On 02/19/2014. (8:30am)    Specialty:  Cardiology   Contact information:   [address] [place] [address] [phone]       Discharge Medications    Medication List    STOP taking these medications       lisinopril 2.5 MG tablet  Commonly known as:  PRINIVIL,ZESTRIL     metoprolol succinate 25 MG 24 hr tablet  Commonly known as:  TOPROL-XL      TAKE these medications       albuterol 108 (90 BASE) MCG/ACT inhaler  Commonly known as:  PROVENTIL HFA;VENTOLIN HFA  Inhale 2  puffs into the lungs every 6 (six) hours as needed for wheezing.     aspirin EC 81 MG tablet  Take 81 mg by mouth every morning.     atorvastatin 10 MG tablet  Commonly known as:  LIPITOR  Take 1 tablet (10 mg total) by mouth daily at 6 PM.     bismuth subsalicylate 676 PP/50DT suspension  Commonly known as:  PEPTO BISMOL  Take 30 mLs by mouth every 6 (six) hours as needed for diarrhea or loose stools.     magnesium hydroxide 400 MG/5ML suspension  Commonly known as:  MILK OF MAGNESIA  Take by mouth daily as needed for mild constipation or moderate constipation.     metoprolol tartrate 12.5 mg Tabs tablet  Commonly known as:  LOPRESSOR  Take 0.5 tablets (12.5 mg total) by mouth 2 (two) times daily.     omeprazole 20 MG capsule  Commonly known as:  PRILOSEC  Take 20 mg by mouth daily.     PROBIOTIC DAILY Caps  Take 1 tablet by mouth daily.     tamsulosin 0.4 MG Caps capsule  Commonly known as:  FLOMAX  Take 0.4 mg by mouth daily after supper.     ticagrelor 90 MG Tabs tablet  Commonly known as:  BRILINTA  Take 1 tablet (90 mg total) by mouth 2 (two) times daily.         Duration of Discharge Encounter   Greater than 30 minutes including physician time.  Signed, Almyra Deforest PA-C 02/13/2014, 10:00 AM

## 2014-02-13 NOTE — Discharge Instructions (Signed)
Coronary Angiogram with Stent Coronary angiography with stent placement is a procedure to widen or open a narrow blood vessel of the heart (coronary artery). When a coronary artery becomes partially blocked, it decreases blood flow to that area. This may lead to chest pain or a heart attack (myocardial infarction). Arteries may become blocked by cholesterol buildup (plaque) in the lining or wall.  A stent is a small piece of metal that looks like a mesh or a spring. Stent placement may be done right after a coronary angiography in which a blocked artery is found or as a treatment for a heart attack.  LET [facility] CARE PROVIDER KNOW ABOUT:  Any allergies you have.   All medicines you are taking, including vitamins, herbs, eye drops, creams, and over-the-counter medicines.   Previous problems you or members of your family have had with the use of anesthetics.   Any blood disorders you have.   Previous surgeries you have had.   Medical conditions you have. RISKS AND COMPLICATIONS Generally, coronary angiography with stent is a safe procedure. However, as with any procedure, complications can occur. Possible complications include:   Damage to the heart or its blood vessels.   A return of blockage.   Bleeding, infection, or bruising at the insertion site.   A collection of blood under the skin (hematoma) at the insertion site.  Blood clot in another part of the body.   Kidney injury.   Allergic reaction to the dye or contrast used.   Bleeding into the abdomen (retroperitoneal bleeding) BEFORE THE PROCEDURE  Do not eat or drink anything for 6 hours before the procedure.   Ask your health care provider about changing or stopping your regular medicines. This is especially important if you are taking diabetes medicines or blood thinners.  Your health care provider will make sure you understand the procedure and the risks and potential complications associated with the  procedure.  PROCEDURE  You may be given a medicine to help you relax before and during the procedure (sedative). This medicine will be given through an IV tube that is put into one of your veins.   The area where the catheter will be inserted is shaved and cleaned. This is usually done in the groin but may be done in the fold of your arm (near your elbow) or in the wrist.   A medicine will be given to numb the area where the catheter will be inserted (local anesthetic).   The catheter is inserted into an artery using a guide wire. A type of X-ray (fluoroscopy) is used to help guide the catheter to the opening of the blocked artery.   A dye is then injected into the catheter, and X-rays are taken. The dye helps to show where any narrowing or blockages are located in the heart arteries.   A tiny wire is guided to the blocked spot, and a balloon is inflated to make the artery wider. The stent is expanded and crushes the plaque into the wall of the vessel. The stent holds the area open like a scaffolding and improves the blood flow.   Sometimes the artery may be made wider using a laser or other tools to remove plaque.   When the blood flow is better, the catheter is removed. The lining of the artery will grow over the stent, which stays where it was placed.  AFTER THE PROCEDURE  If the procedure is done through the leg, you will be kept in  bed lying flat for about 6 hours. You will be instructed to not bend or cross your legs.   The insertion site will be checked frequently.   The pulse in your feet or wrist will be checked frequently.   Additional blood tests, X-rays, and electrocardiography may be done. Document Released: 01/28/2003 Document Revised: 07/29/2013 Document Reviewed: 01/30/2013 [facility] Patient Information 2015 [place], [place]. This information is not intended to replace advice given to you by your health care provider. Make sure you discuss any questions you have  with your health care provider.  CPR for Adults, Compressions Only Adult cardiopulmonary resuscitation (CPR) consists of Circulation, Airway and Breathing (C - A - B). CPR is a skill that almost anyone can learn and perform. It can be life saving for someone who has suffered Sudden Cardiac Arrest (SCA). SCA occurs when the heart suddenly and unexpectedly stops beating. When this occurs, blood stops flowing to the brain and other vital organs. SCA usually causes death if not treated in minutes. Early CPR improves a victim's survival chances, yet CPR is often not started until trained rescuers arrive. There are many reasons for this delay in early CPR. In an effort to improve the victim's survival, it is now recommended that untrained rescuers or rescuers unable to provide rescue breaths, do chest compressions without rescue breaths (Compressions - Only CPR). Doing the chest compressions (circulation) of CPR until trained rescuers arrive can improve the victim's survival chances. If you are untrained in the steps of CPR or unable to provide rescue breaths and either see an adult collapse and lose consciousness or suddenly come upon an adult who appears unconscious, do the following: 1. Scene Safety. Quickly look at the area where the ill and/or injured adult is located. Go to help the victim only if it appears that the location (a car, street, room, hillside, etc.) is safe to enter. 2. Check for Response. Shake the victim or tap a shoulder and ask "Are you all right?" If the victim responds and is breathing normally but is ill and/or injured, call for emergency services (911 in the [place] and wait for help. While waiting, re-check the victim frequently. 3. Get Help.If there is no response and the victim is not breathing or only gasping, call for emergency services (911 in [place] If nearby and easily accessible, get an Automated External Defibrillator (AED). The AED or defibrillator provides a shock to the  heart which can restart the heart. AED devices are found in many public transportation vehicles, airports and large buildings. After calling for emergency services and possibly getting an AED, return to the victim as soon as possible and use the AED (if available) or start chest compressions. 4. Position the Victim. If possible, the victim should be on a hard surface facing up. You may need to roll the victim into this position. 5. Defibrillation. At any time an AED is available and the victim is unresponsive, not breathing or only gasping, use the AED as soon as possible. Follow directions on the AED to deliver 1 defibrillation (shock). Once 1 shock is delivered, it is recommended that 2 minutes of compressions be performed before another shock is delivered. 6. Chest Compressions. Start chest compressions. These are repeated, rapid, relatively hard up and down compressions of the chest. Kneel next to the patient's chest. The heel of one hand is placed over the lower half of the sternum (breast bone) at the middle of the chest. The heel of the other hand is placed  on top of the first hand so that the hands overlap and are somewhat parallel. Depress the chest 2 inches (5 cm) and then allow it to return completely to its normal position. This needs to be done rapidly to give the victim 100 compressions or more per minute. 7. Continue Compressions and Defibrillation. After every 2 minutes of compressions, attempt defibrillation. Continue chest compressions and defibrillation until the victim starts to breathe normally or ambulance personnel take over care. CPR is intense and strenuous for the rescuer but can be lifesaving. It is understood that the rescuer can become tired fairly quickly. It is also understood that attempts by rescuers give a better chance at saving a life than doing nothing and waiting for ambulance personnel to arrive. After ambulance personnel arrive, it is important to remain on scene to give  trained rescuers a report of what happened. This is always helpful and an important part of the overall care provided to the victim. Almost all communities maintain programs for training the public in the skills of CPR. It is always helpful to know CPR in the unlikely event that your help may be needed. See the following site to search for a CPR course near you: http://gonzalez-rivas.net/.jsp?pid=ahaweb.classconnector.home# Document Released: 07/12/2009 Document Revised: 10/16/2011 Document Reviewed: 07/12/2009 [facility] Patient Information 2015 [place], [place]. This information is not intended to replace advice given to you by your health care provider. Make sure you discuss any questions you have with your health care provider.   No driving for 24 hours. No lifting over 5 lbs for 1 week. No sexual activity for 1 week. Keep procedure site clean & dry. If you notice increased pain, swelling, bleeding or pus, call/return!  You may shower, but no soaking baths/hot tubs/pools for 1 week.

## 2014-02-13 NOTE — Progress Notes (Signed)
CARDIAC REHAB PHASE I   PRE:  Rate/Rhythm: 82 SR  BP:  Supine:   Sitting: 116/63  Standing:    SaO2:   MODE:  Ambulation: 1000 ft   POST:  Rate/Rhythm: 93 SR short burst of irregular increased HR 140  BP:  Supine:   Sitting: 170/76  Standing:    SaO2:  [id number] Pt tolerated ambulaiton well without c/o of cp or SOB. HR after walk for short burst up to 140 irregular, pt not symptomatic. Completed MI education with pt and wife. They voice understanding. Pt agrees to [place]. CRP in [place], will send referral. Pt extremely talkative and hard to keep him on subject.   Rodney Langton RN 02/13/2014 9:45 AM

## 2014-02-16 NOTE — Discharge Summary (Signed)
The patient has had a favorable outcome despite a presentation including cardiac arrest. He denies chest pain. He denies dyspnea. His laboratory data is unremarkable. Cath site is unremarkable. He would be discharged home today and followup with his primary cardiologist, Dr. Domenic Polite within 7-10 days.

## 2014-02-17 NOTE — Progress Notes (Signed)
Home today

## 2014-02-19 ENCOUNTER — Ambulatory Visit (INDEPENDENT_AMBULATORY_CARE_PROVIDER_SITE_OTHER): Payer: Medicare Other | Admitting: Cardiology

## 2014-02-19 ENCOUNTER — Encounter: Payer: Self-pay | Admitting: Cardiology

## 2014-02-19 VITALS — BP 109/70 | HR 90 | Ht 70.0 in | Wt 199.0 lb

## 2014-02-19 DIAGNOSIS — I4891 Unspecified atrial fibrillation: Secondary | ICD-10-CM

## 2014-02-19 DIAGNOSIS — I2589 Other forms of chronic ischemic heart disease: Secondary | ICD-10-CM

## 2014-02-19 DIAGNOSIS — I480 Paroxysmal atrial fibrillation: Secondary | ICD-10-CM

## 2014-02-19 DIAGNOSIS — I2510 Atherosclerotic heart disease of native coronary artery without angina pectoris: Secondary | ICD-10-CM

## 2014-02-19 DIAGNOSIS — E782 Mixed hyperlipidemia: Secondary | ICD-10-CM

## 2014-02-19 DIAGNOSIS — I255 Ischemic cardiomyopathy: Secondary | ICD-10-CM

## 2014-02-19 NOTE — Assessment & Plan Note (Signed)
LVEF approximately 50% by recent echocardiogram with RV dysfunction. Form of Ebstein's was considered, but could also be secondary to RV involvement from his infarct. Can reassess echocardiogram later.

## 2014-02-19 NOTE — Progress Notes (Signed)
Clinical Summary [name] is an 80 y.o.male last seen in the office in June 2014, a former patient of Dr. Dannielle [name]. At that time he was clinically stable without active angina symptoms on medical therapy. Record review finds recent hospitalization at [facility] in early July with an inferior STEMI. Cardiac catheterization demonstrated patent LIMA to LAD, occluded SVG to RCA which was treated with DES, 80% blockage in the proximal and distal sequential SVG to OM 1 and OM 2. This procedure was complicated by cardiac arrest requiring supportive measures. He recovered well and ultimately underwent staged intervention with placement of DES x2 to the SVG to OM system.  Echocardiogram on July 6 reported LVEF 50% with inferior basal hypokinesis, RV dilatation and dyskinesis (form of Ebstein's anomaly was suggested, although could be secondary to RV infarction as well), trivial aortic regurgitation, moderate tricuspid regurgitation, RVSP 39 mm mercury.  Lab work showed hemoglobin 11.6, platelets 206, potassium 4.5, BUN 26, creatinine 1.5, cholesterol 174, triglycerides 104, HDL 46, LDL 107, peak troponins greater than 20.  He is here with his daughter today. Reports compliance with his medications, still somewhat weak, but no dizziness or chest pain. His ECG shows sinus rhythm with LVH and evolutionary changes of recent inferior wall infarct. Reports no bleeding problems, resolving ecchymoses in his right groin site.   Allergies  Allergen Reactions  . Iodinated Diagnostic Agents Other (See Comments)    Causes intermediate diaphoresis and hypotension  . Statins Other (See Comments)    Has tried several, muscle weakness and myalgias  . Plavix [Clopidogrel Bisulfate] Other (See Comments)    PER FAMILY - Patient complained of muscular myalgias when taking medication and cardiologist from around his time of CABG in 2006 took him off med, due to leg myalgia reaction. No shortness of breath, hives, rash or  anything else. Patient has a Hx of leg muscle myalgias, FYI. 02/09/14    Current Outpatient Prescriptions  Medication Sig Dispense Refill  . albuterol (PROVENTIL HFA;VENTOLIN HFA) 108 (90 BASE) MCG/ACT inhaler Inhale 2 puffs into the lungs every 6 (six) hours as needed for wheezing.      Marland Kitchen aspirin EC 81 MG tablet Take 81 mg by mouth every morning.       Marland Kitchen atorvastatin (LIPITOR) 10 MG tablet Take 1 tablet (10 mg total) by mouth daily at 6 PM.  30 tablet  12  . bismuth subsalicylate (PEPTO BISMOL) 262 MG/15ML suspension Take 30 mLs by mouth every 6 (six) hours as needed for diarrhea or loose stools.      Marland Kitchen glipiZIDE (GLUCOTROL XL) 2.5 MG 24 hr tablet Take 2.5 mg by mouth daily with breakfast.      . magnesium hydroxide (MILK OF MAGNESIA) 400 MG/5ML suspension Take by mouth daily as needed for mild constipation or moderate constipation.      . metoprolol tartrate (LOPRESSOR) 25 MG tablet Take 12.5 mg by mouth 2 (two) times daily.      . pantoprazole (PROTONIX) 40 MG tablet Take 40 mg by mouth daily.      . Tamsulosin HCl (FLOMAX) 0.4 MG CAPS Take 0.4 mg by mouth daily after supper.       . ticagrelor (BRILINTA) 90 MG TABS tablet Take 1 tablet (90 mg total) by mouth 2 (two) times daily.  60 tablet  0   No current facility-administered medications for this visit.    Past Medical History  Diagnosis Date  . Coronary atherosclerosis of native coronary artery  a. s/p CABG   b. inferior STEMI 02/09/2014 LIMA to LAD patent, occluded SVG to RCA, 80% prox and distal seg SVG to OM1 and OM2. DES to SVG to RCA.  c. staged cath 7/9 DES x 2 to SVG to OM  . Renal insufficiency   . TIA (transient ischemic attack)     Questionable right middle cerebral artery event  . COPD (chronic obstructive pulmonary disease)   . Mixed hyperlipidemia   . Nephrolithiasis   . Skin cancer of nose   . Diverticulosis 2006  . Asbestos exposure     Followed in [place]    Past Surgical History  Procedure Laterality Date    . Coronary artery bypass graft  2006    LIMA to LAD, SVG to OM, SVG to OM, SVG to PDA    Social History [name] reports that he quit smoking about 10 years ago. His smoking use included Cigarettes. He has a 25 pack-year smoking history. He quit smokeless tobacco use about 10 years ago. His smokeless tobacco use included Chew. [name] reports that he does not drink alcohol.  Review of Systems No palpitations, dizziness, syncope. Good appetite. Other systems reviewed and negative except as outlined.  Physical Examination Filed Vitals:   02/19/14 0838  BP: 109/70  Pulse: 90   Filed Weights   02/19/14 0838  Weight: 199 lb (90.266 kg)    Overweight male, comfortable at rest.  HEENT: Conjunctiva and lids normal, oropharynx clear.  Neck: Supple, no elevated JVP or carotid bruits, no thyromegaly.  Lungs: Course but clear to auscultation, nonlabored breathing at rest.  Cardiac: Regular rate and rhythm, no S3, 2/6 systolic murmur, no pericardial rub.  Abdomen: Soft, nontender, bowel sounds present.  Extremities: No pitting edema, distal pulses 2+. Ecchymosis right groin and thigh. Skin: Warm and dry.  Musculoskeletal: No kyphosis.  Neuropsychiatric: Alert and oriented x3, affect grossly appropriate.   Problem List and Plan   CORONARY ATHEROSCLEROSIS NATIVE CORONARY ARTERY Recent inferior STEMI complicated by bradycardic arrest, possible RV involvement as well. Clinically, he is recuperating well at this time, now status post DES to the SVG to RCA and staged DES x2 to the SVG to OM system. LVEF approximately 50%. Plan to continue current regimen, not on ACE inhibitor with low normal blood pressure, also has baseline renal insufficiency. We be able to eventually uptitrate beta blocker. Reinforced compliance with DAPT. Followup arranged in the next 6 weeks.  PAF (paroxysmal atrial fibrillation) Noted in the setting of acute infarct, in sinus rhythm now. No plans for full  anticoagulation at this time, particularly in light of ongoing need for DAPT.  Cardiomyopathy, ischemic LVEF approximately 50% by recent echocardiogram with RV dysfunction. Form of Ebstein's was considered, but could also be secondary to RV involvement from his infarct. Can reassess echocardiogram later.  Mixed hyperlipidemia Continues on Lipitor.    Satira Sark, M.D., F.A.C.C.

## 2014-02-19 NOTE — Patient Instructions (Signed)
Your physician recommends that you schedule a follow-up appointment in: 6 weeks. Your physician recommends that you continue on your current medications as directed. Please refer to the Current Medication list given to you today. 

## 2014-02-19 NOTE — Assessment & Plan Note (Signed)
Continues on Lipitor.

## 2014-02-19 NOTE — Assessment & Plan Note (Signed)
Recent inferior STEMI complicated by bradycardic arrest, possible RV involvement as well. Clinically, he is recuperating well at this time, now status post DES to the SVG to RCA and staged DES x2 to the SVG to OM system. LVEF approximately 50%. Plan to continue current regimen, not on ACE inhibitor with low normal blood pressure, also has baseline renal insufficiency. We be able to eventually uptitrate beta blocker. Reinforced compliance with DAPT. Followup arranged in the next 6 weeks.

## 2014-02-19 NOTE — Addendum Note (Signed)
Addended by: Merlene Laughter on: 02/19/2014 11:10 AM   Modules accepted: Orders

## 2014-02-19 NOTE — Assessment & Plan Note (Signed)
Noted in the setting of acute infarct, in sinus rhythm now. No plans for full anticoagulation at this time, particularly in light of ongoing need for DAPT.

## 2014-02-26 ENCOUNTER — Inpatient Hospital Stay (HOSPITAL_COMMUNITY)
Admission: RE | Admit: 2014-02-26 | Discharge: 2014-02-26 | Disposition: A | Discharge status: Home / Self Care | Payer: Medicare Other | Source: Ambulatory Visit

## 2014-02-26 NOTE — Progress Notes (Signed)
Cardiac Rehab Medication Review by a Pharmacist  Does the patient  feel that his/her medications are working for him/her?  yes  Has the patient been experiencing any side effects to the medications prescribed?  Yes - low blood pressure at times  Does the patient measure his/her own blood pressure or blood glucose at home?  yes   Does the patient have any problems obtaining medications due to transportation or finances?   no  Understanding of regimen: good Understanding of indications: good Potential of compliance: good    Pharmacist comments: 80 yo male presented with his daughter to the cardiac rehab clinic today. Patient expresses good understanding of medication regimens with few side effects other than low blood pressure (dizziness) at times.  Patient has daughter and wife that help him remember to take his medications. Patient expresses no concerns at this time.  Mayline Dragon L. Nicole Kindred, PharmD Clinical Pharmacy Resident Pager: [phone] 02/26/2014 8:14 AM

## 2014-03-02 ENCOUNTER — Encounter (HOSPITAL_COMMUNITY)
Admission: RE | Admit: 2014-03-02 | Discharge: 2014-03-02 | Disposition: A | Discharge status: Home / Self Care | Payer: Medicare Other | Source: Ambulatory Visit | Attending: Cardiology | Admitting: Cardiology

## 2014-03-02 DIAGNOSIS — I2510 Atherosclerotic heart disease of native coronary artery without angina pectoris: Secondary | ICD-10-CM | POA: Diagnosis present

## 2014-03-02 DIAGNOSIS — I471 Supraventricular tachycardia, unspecified: Secondary | ICD-10-CM | POA: Insufficient documentation

## 2014-03-02 DIAGNOSIS — Z5189 Encounter for other specified aftercare: Secondary | ICD-10-CM | POA: Insufficient documentation

## 2014-03-02 DIAGNOSIS — I4891 Unspecified atrial fibrillation: Secondary | ICD-10-CM | POA: Insufficient documentation

## 2014-03-02 DIAGNOSIS — I2589 Other forms of chronic ischemic heart disease: Secondary | ICD-10-CM | POA: Insufficient documentation

## 2014-03-02 LAB — GLUCOSE, CAPILLARY
Glucose-Capillary: 130 mg/dL — ABNORMAL HIGH (ref 70–99)
Glucose-Capillary: 119 mg/dL — ABNORMAL HIGH (ref 70–99)

## 2014-03-02 NOTE — Progress Notes (Signed)
Pt in today for his first day of exercise in the 1115 exercise class.  Pt tolerated light exercise with no complaints except pt did remark that walking is hard for him.  Pt ambulated 4 laps (200 feet) for 10 minutes.  Pt maintained O2 sats during exertion 97-98%.Pt with history of COPD  Pt remarked that he rarely uses his prn inhaler "been about a month".  Monitor showed Sr with Rare Pvc and one fusion beat.  Medication list reconciled. PHQ2 score 0.  Pt with supportive family and feels positive in regards to his recovery and healthy outlook.  Pt short term goal is be normal and long term is to be normal and play golf.  Will monitor he MET levels and review home exercise. Will monitor his progress toward meeting this goal. Maurice Small RN, BSN

## 2014-03-04 ENCOUNTER — Encounter (HOSPITAL_COMMUNITY)
Admission: RE | Admit: 2014-03-04 | Discharge: 2014-03-04 | Disposition: A | Discharge status: Home / Self Care | Payer: Medicare Other | Source: Ambulatory Visit | Attending: Cardiology | Admitting: Cardiology

## 2014-03-04 DIAGNOSIS — Z5189 Encounter for other specified aftercare: Secondary | ICD-10-CM | POA: Diagnosis not present

## 2014-03-04 LAB — GLUCOSE, CAPILLARY
GLUCOSE-CAPILLARY: 121 mg/dL — AB (ref 70–99)
Glucose-Capillary: 102 mg/dL — ABNORMAL HIGH (ref 70–99)

## 2014-03-06 ENCOUNTER — Encounter (HOSPITAL_COMMUNITY)
Admission: RE | Admit: 2014-03-06 | Discharge: 2014-03-06 | Disposition: A | Discharge status: Home / Self Care | Payer: Medicare Other | Source: Ambulatory Visit | Attending: Cardiology | Admitting: Cardiology

## 2014-03-06 DIAGNOSIS — Z5189 Encounter for other specified aftercare: Secondary | ICD-10-CM | POA: Diagnosis not present

## 2014-03-06 LAB — GLUCOSE, CAPILLARY
Glucose-Capillary: 122 mg/dL — ABNORMAL HIGH (ref 70–99)
Glucose-Capillary: 125 mg/dL — ABNORMAL HIGH (ref 70–99)

## 2014-03-09 ENCOUNTER — Encounter (HOSPITAL_COMMUNITY)
Admission: RE | Admit: 2014-03-09 | Discharge: 2014-03-09 | Disposition: A | Discharge status: Home / Self Care | Payer: Medicare Other | Source: Ambulatory Visit | Attending: Cardiology | Admitting: Cardiology

## 2014-03-09 DIAGNOSIS — Z9861 Coronary angioplasty status: Secondary | ICD-10-CM | POA: Insufficient documentation

## 2014-03-09 DIAGNOSIS — I219 Acute myocardial infarction, unspecified: Secondary | ICD-10-CM | POA: Diagnosis not present

## 2014-03-10 LAB — GLUCOSE, CAPILLARY
Glucose-Capillary: 137 mg/dL — ABNORMAL HIGH (ref 70–99)
Glucose-Capillary: 151 mg/dL — ABNORMAL HIGH (ref 70–99)

## 2014-03-11 ENCOUNTER — Encounter (HOSPITAL_COMMUNITY)
Admission: RE | Admit: 2014-03-11 | Discharge: 2014-03-11 | Disposition: A | Discharge status: Home / Self Care | Payer: Medicare Other | Source: Ambulatory Visit | Attending: Cardiology | Admitting: Cardiology

## 2014-03-11 DIAGNOSIS — I219 Acute myocardial infarction, unspecified: Secondary | ICD-10-CM | POA: Diagnosis not present

## 2014-03-11 LAB — GLUCOSE, CAPILLARY: Glucose-Capillary: 133 mg/dL — ABNORMAL HIGH (ref 70–99)

## 2014-03-11 NOTE — Progress Notes (Signed)
Reviewed home exercise with pt and daughter today.  Pt plans to conitnue walking at home for exercise.  Reviewed THR, pulse, RPE, sign and symptoms, and when to call 911 or MD.  Pt voiced understanding. Pt also hopes to return to playing golf again. Alberteen Sam, MA, ACSM RCEP

## 2014-03-13 ENCOUNTER — Encounter (HOSPITAL_COMMUNITY)
Admission: RE | Admit: 2014-03-13 | Discharge: 2014-03-13 | Disposition: A | Discharge status: Home / Self Care | Payer: Medicare Other | Source: Ambulatory Visit | Attending: Cardiology | Admitting: Cardiology

## 2014-03-13 DIAGNOSIS — I219 Acute myocardial infarction, unspecified: Secondary | ICD-10-CM | POA: Diagnosis not present

## 2014-03-13 LAB — GLUCOSE, CAPILLARY: Glucose-Capillary: 164 mg/dL — ABNORMAL HIGH (ref 70–99)

## 2014-03-16 ENCOUNTER — Encounter (HOSPITAL_COMMUNITY)
Admission: RE | Admit: 2014-03-16 | Discharge: 2014-03-16 | Disposition: A | Discharge status: Home / Self Care | Payer: Medicare Other | Source: Ambulatory Visit | Attending: Cardiology | Admitting: Cardiology

## 2014-03-16 DIAGNOSIS — I219 Acute myocardial infarction, unspecified: Secondary | ICD-10-CM | POA: Diagnosis not present

## 2014-03-16 LAB — GLUCOSE, CAPILLARY: GLUCOSE-CAPILLARY: 122 mg/dL — AB (ref 70–99)

## 2014-03-18 ENCOUNTER — Encounter (HOSPITAL_COMMUNITY)
Admission: RE | Admit: 2014-03-18 | Discharge: 2014-03-18 | Disposition: A | Discharge status: Home / Self Care | Payer: Medicare Other | Source: Ambulatory Visit | Attending: Cardiology | Admitting: Cardiology

## 2014-03-18 DIAGNOSIS — I219 Acute myocardial infarction, unspecified: Secondary | ICD-10-CM | POA: Diagnosis not present

## 2014-03-18 LAB — GLUCOSE, CAPILLARY: Glucose-Capillary: 111 mg/dL — ABNORMAL HIGH (ref 70–99)

## 2014-03-20 ENCOUNTER — Encounter (HOSPITAL_COMMUNITY): Payer: Medicare Other

## 2014-03-23 ENCOUNTER — Encounter (HOSPITAL_COMMUNITY): Payer: Medicare Other

## 2014-03-25 ENCOUNTER — Encounter (HOSPITAL_COMMUNITY)
Admission: RE | Admit: 2014-03-25 | Discharge: 2014-03-25 | Disposition: A | Discharge status: Home / Self Care | Payer: Medicare Other | Source: Ambulatory Visit | Attending: Cardiology | Admitting: Cardiology

## 2014-03-25 DIAGNOSIS — I219 Acute myocardial infarction, unspecified: Secondary | ICD-10-CM | POA: Diagnosis not present

## 2014-03-25 LAB — GLUCOSE, CAPILLARY: GLUCOSE-CAPILLARY: 132 mg/dL — AB (ref 70–99)

## 2014-03-27 ENCOUNTER — Encounter (HOSPITAL_COMMUNITY)
Admission: RE | Admit: 2014-03-27 | Discharge: 2014-03-27 | Disposition: A | Discharge status: Home / Self Care | Payer: Medicare Other | Source: Ambulatory Visit | Attending: Cardiology | Admitting: Cardiology

## 2014-03-27 DIAGNOSIS — I219 Acute myocardial infarction, unspecified: Secondary | ICD-10-CM | POA: Diagnosis not present

## 2014-03-27 LAB — GLUCOSE, CAPILLARY: GLUCOSE-CAPILLARY: 162 mg/dL — AB (ref 70–99)

## 2014-03-30 ENCOUNTER — Encounter (HOSPITAL_COMMUNITY)
Admission: RE | Admit: 2014-03-30 | Discharge: 2014-03-30 | Disposition: A | Discharge status: Home / Self Care | Payer: Medicare Other | Source: Ambulatory Visit | Attending: Cardiology | Admitting: Cardiology

## 2014-03-30 DIAGNOSIS — I219 Acute myocardial infarction, unspecified: Secondary | ICD-10-CM | POA: Diagnosis not present

## 2014-03-30 LAB — GLUCOSE, CAPILLARY: GLUCOSE-CAPILLARY: 148 mg/dL — AB (ref 70–99)

## 2014-04-01 ENCOUNTER — Encounter (HOSPITAL_COMMUNITY)
Admission: RE | Admit: 2014-04-01 | Discharge: 2014-04-01 | Disposition: A | Discharge status: Home / Self Care | Payer: Medicare Other | Source: Ambulatory Visit | Attending: Cardiology | Admitting: Cardiology

## 2014-04-01 DIAGNOSIS — I219 Acute myocardial infarction, unspecified: Secondary | ICD-10-CM | POA: Diagnosis not present

## 2014-04-01 LAB — GLUCOSE, CAPILLARY
Glucose-Capillary: 110 mg/dL — ABNORMAL HIGH (ref 70–99)
Glucose-Capillary: 94 mg/dL (ref 70–99)

## 2014-04-03 ENCOUNTER — Encounter (HOSPITAL_COMMUNITY)
Admission: RE | Admit: 2014-04-03 | Discharge: 2014-04-03 | Disposition: A | Discharge status: Home / Self Care | Payer: Medicare Other | Source: Ambulatory Visit | Attending: Cardiology | Admitting: Cardiology

## 2014-04-03 DIAGNOSIS — I219 Acute myocardial infarction, unspecified: Secondary | ICD-10-CM | POA: Diagnosis not present

## 2014-04-03 LAB — GLUCOSE, CAPILLARY: Glucose-Capillary: 134 mg/dL — ABNORMAL HIGH (ref 70–99)

## 2014-04-06 ENCOUNTER — Encounter (HOSPITAL_COMMUNITY)
Admission: RE | Admit: 2014-04-06 | Discharge: 2014-04-06 | Disposition: A | Discharge status: Home / Self Care | Payer: Medicare Other | Source: Ambulatory Visit | Attending: Cardiology | Admitting: Cardiology

## 2014-04-06 DIAGNOSIS — I219 Acute myocardial infarction, unspecified: Secondary | ICD-10-CM | POA: Diagnosis not present

## 2014-04-06 LAB — GLUCOSE, CAPILLARY: Glucose-Capillary: 152 mg/dL — ABNORMAL HIGH (ref 70–99)

## 2014-04-08 ENCOUNTER — Encounter (HOSPITAL_COMMUNITY)
Admission: RE | Admit: 2014-04-08 | Discharge: 2014-04-08 | Disposition: A | Discharge status: Home / Self Care | Payer: Medicare Other | Source: Ambulatory Visit | Attending: Cardiology | Admitting: Cardiology

## 2014-04-08 DIAGNOSIS — I219 Acute myocardial infarction, unspecified: Secondary | ICD-10-CM | POA: Diagnosis not present

## 2014-04-08 DIAGNOSIS — Z9861 Coronary angioplasty status: Secondary | ICD-10-CM | POA: Diagnosis present

## 2014-04-10 ENCOUNTER — Encounter (HOSPITAL_COMMUNITY)
Admission: RE | Admit: 2014-04-10 | Discharge: 2014-04-10 | Disposition: A | Discharge status: Home / Self Care | Payer: Medicare Other | Source: Ambulatory Visit | Attending: Cardiology | Admitting: Cardiology

## 2014-04-10 ENCOUNTER — Ambulatory Visit: Payer: Medicare Other | Admitting: Cardiology

## 2014-04-10 DIAGNOSIS — I219 Acute myocardial infarction, unspecified: Secondary | ICD-10-CM | POA: Diagnosis not present

## 2014-04-10 LAB — GLUCOSE, CAPILLARY: Glucose-Capillary: 145 mg/dL — ABNORMAL HIGH (ref 70–99)

## 2014-04-15 ENCOUNTER — Encounter (HOSPITAL_COMMUNITY)
Admission: RE | Admit: 2014-04-15 | Discharge: 2014-04-15 | Disposition: A | Discharge status: Home / Self Care | Payer: Medicare Other | Source: Ambulatory Visit | Attending: Cardiology | Admitting: Cardiology

## 2014-04-15 DIAGNOSIS — I219 Acute myocardial infarction, unspecified: Secondary | ICD-10-CM | POA: Diagnosis not present

## 2014-04-15 LAB — GLUCOSE, CAPILLARY: GLUCOSE-CAPILLARY: 186 mg/dL — AB (ref 70–99)

## 2014-04-15 NOTE — Progress Notes (Signed)
Roger [name] 80 y.o. male Nutrition Note Spoke with pt.  Nutrition Plan and Nutrition Survey goals reviewed with pt. Pt is following Step 2 of the Therapeutic Lifestyle Changes diet. Pt is diabetic. Last A1c indicates blood glucose well-controlled. Per discussion, pt reports his MD discontinued his Glipizide. Pt is currently a diet-controlled DM. Pt continues to check CBG's once daily and fasting CBG's reportedly "around 140 mg/dL." This writer went over Diabetes Education test results. Pt expressed understanding of the information reviewed. Pt aware of nutrition education classes offered. Lab Results  Component Value Date   HGBA1C 6.3* 02/09/2014   Nutrition Diagnosis   Food-and nutrition-related knowledge deficit related to lack of exposure to information as related to diagnosis of: ? CVD ? DM (A1c 6.3)  Nutrition RX/ Estimated Daily Nutrition Needs for: wt maintenance 2200-2550 Kcal, 70-85 gm fat, 14-17 gm sat fat, 2.2-2.5 gm trans-fat, <1500 mg sodium, 250-325 gm CHO   Nutrition Intervention   Pt's individual nutrition plan reviewed with pt.   Benefits of adopting Therapeutic Lifestyle Changes discussed when Medficts reviewed.   Pt to attend the Portion Distortion class - met 03/11/14   Pt to attend the  ? Nutrition I class                     ? Nutrition II class        ? Diabetes Blitz class       ? Diabetes Q & A class   Continue client-centered nutrition education by RD, as part of interdisciplinary care. Goal(s)   Pt to describe the benefit of including fruits, vegetables, whole grains, and low-fat dairy products in a heart healthy meal plan.   CBG concentrations in the normal range or as close to normal as is safely possible. Monitor and Evaluate progress toward nutrition goal with team. Nutrition Risk:  Change to Moderate Roger [name], M.Ed, RD, LDN, CDE 04/15/2014 12:32 PM

## 2014-04-17 ENCOUNTER — Encounter (HOSPITAL_COMMUNITY)
Admission: RE | Admit: 2014-04-17 | Discharge: 2014-04-17 | Disposition: A | Discharge status: Home / Self Care | Payer: Medicare Other | Source: Ambulatory Visit | Attending: Cardiology | Admitting: Cardiology

## 2014-04-17 DIAGNOSIS — I219 Acute myocardial infarction, unspecified: Secondary | ICD-10-CM | POA: Diagnosis not present

## 2014-04-17 LAB — GLUCOSE, CAPILLARY: GLUCOSE-CAPILLARY: 164 mg/dL — AB (ref 70–99)

## 2014-04-20 ENCOUNTER — Encounter (HOSPITAL_COMMUNITY)
Admission: RE | Admit: 2014-04-20 | Discharge: 2014-04-20 | Disposition: A | Discharge status: Home / Self Care | Payer: Medicare Other | Source: Ambulatory Visit | Attending: Cardiology | Admitting: Cardiology

## 2014-04-20 DIAGNOSIS — I219 Acute myocardial infarction, unspecified: Secondary | ICD-10-CM | POA: Diagnosis not present

## 2014-04-20 LAB — GLUCOSE, CAPILLARY: Glucose-Capillary: 204 mg/dL — ABNORMAL HIGH (ref 70–99)

## 2014-04-22 ENCOUNTER — Encounter (HOSPITAL_COMMUNITY)
Admission: RE | Admit: 2014-04-22 | Discharge: 2014-04-22 | Disposition: A | Discharge status: Home / Self Care | Payer: Medicare Other | Source: Ambulatory Visit | Attending: Cardiology | Admitting: Cardiology

## 2014-04-22 DIAGNOSIS — I219 Acute myocardial infarction, unspecified: Secondary | ICD-10-CM | POA: Diagnosis not present

## 2014-04-22 LAB — GLUCOSE, CAPILLARY: Glucose-Capillary: 123 mg/dL — ABNORMAL HIGH (ref 70–99)

## 2014-04-22 NOTE — Progress Notes (Signed)
Pt with elevation in weight today 1.7kg. Pt admitted to dietary indiscretions.  Continue to monitor. Cherre Huger, BSN

## 2014-04-24 ENCOUNTER — Encounter (HOSPITAL_COMMUNITY)
Admission: RE | Admit: 2014-04-24 | Discharge: 2014-04-24 | Disposition: A | Discharge status: Home / Self Care | Payer: Medicare Other | Source: Ambulatory Visit | Attending: Cardiology | Admitting: Cardiology

## 2014-04-24 DIAGNOSIS — I219 Acute myocardial infarction, unspecified: Secondary | ICD-10-CM | POA: Diagnosis not present

## 2014-04-24 LAB — GLUCOSE, CAPILLARY: GLUCOSE-CAPILLARY: 194 mg/dL — AB (ref 70–99)

## 2014-04-27 ENCOUNTER — Encounter (HOSPITAL_COMMUNITY): Payer: Medicare Other

## 2014-04-28 ENCOUNTER — Encounter: Payer: Self-pay | Admitting: Cardiology

## 2014-04-28 ENCOUNTER — Ambulatory Visit (INDEPENDENT_AMBULATORY_CARE_PROVIDER_SITE_OTHER): Payer: Medicare Other | Admitting: Cardiology

## 2014-04-28 VITALS — BP 130/74 | HR 87 | Ht 70.0 in | Wt 199.1 lb

## 2014-04-28 DIAGNOSIS — I2589 Other forms of chronic ischemic heart disease: Secondary | ICD-10-CM

## 2014-04-28 DIAGNOSIS — I2510 Atherosclerotic heart disease of native coronary artery without angina pectoris: Secondary | ICD-10-CM

## 2014-04-28 DIAGNOSIS — I255 Ischemic cardiomyopathy: Secondary | ICD-10-CM

## 2014-04-28 DIAGNOSIS — E782 Mixed hyperlipidemia: Secondary | ICD-10-CM

## 2014-04-28 NOTE — Assessment & Plan Note (Signed)
He continues on Lipitor.

## 2014-04-28 NOTE — Patient Instructions (Signed)
Your physician recommends that you schedule a follow-up appointment in: 3 months. Your physician recommends that you continue on your current medications as directed. Please refer to the Current Medication list given to you today. 

## 2014-04-28 NOTE — Assessment & Plan Note (Signed)
Continue current medical regimen, plan to complete cardiac rehabilitation. We will schedule a followup in the next 3 months.

## 2014-04-28 NOTE — Assessment & Plan Note (Signed)
LVEF approximately 50% by recent echocardiogram with RV dysfunction. Form of Ebstein's was considered, but could also be secondary to RV involvement from his infarct.

## 2014-04-28 NOTE — Progress Notes (Signed)
Clinical Summary Roger [name] is an 80 y.o.male last seen in July. He has been participating in cardiac rehabilitation. He has had fluctuating blood sugars, this is being managed by Dr. Woody Seller. He missed his rehab session on Monday since he had felt dizzy the day before in church. He stated Dr. Woody Seller diagnosed him with vertigo. He plans to go back tomorrow. No problems with angina. He reports compliance with his medication, easy bruising on DAPT, but no spontaneous bleeding.   He is status post DES to the SVG to RCA and staged DES x2 to the SVG to OM system, LVEF approximately 50%. This is in the setting of an inferior STEMI with probable RV involvement in July.  Echocardiogram on July 6 reported LVEF 50% with inferior basal hypokinesis, RV dilatation and dyskinesis (form of Ebstein's anomaly was suggested, although could be secondary to RV infarction as well), trivial aortic regurgitation, moderate tricuspid regurgitation, RVSP 39 mm mercury.   Allergies  Allergen Reactions  . Iodinated Diagnostic Agents Other (See Comments)    Causes intermediate diaphoresis and hypotension  . Statins Other (See Comments)    Tolerating well    Current Outpatient Prescriptions  Medication Sig Dispense Refill  . albuterol (PROVENTIL HFA;VENTOLIN HFA) 108 (90 BASE) MCG/ACT inhaler Inhale 2 puffs into the lungs every 6 (six) hours as needed for wheezing.      Marland Kitchen aspirin EC 81 MG tablet Take 81 mg by mouth every morning.       Marland Kitchen atorvastatin (LIPITOR) 10 MG tablet Take 1 tablet (10 mg total) by mouth daily at 6 PM.  30 tablet  12  . bismuth subsalicylate (PEPTO BISMOL) 262 MG/15ML suspension Take 30 mLs by mouth every 6 (six) hours as needed for diarrhea or loose stools.      . magnesium hydroxide (MILK OF MAGNESIA) 400 MG/5ML suspension Take by mouth daily as needed for mild constipation or moderate constipation.      . metoprolol tartrate (LOPRESSOR) 25 MG tablet Take 12.5 mg by mouth 2 (two) times daily.         . pantoprazole (PROTONIX) 40 MG tablet Take 40 mg by mouth daily.      . Tamsulosin HCl (FLOMAX) 0.4 MG CAPS Take 0.4 mg by mouth daily after supper.       . ticagrelor (BRILINTA) 90 MG TABS tablet Take 1 tablet (90 mg total) by mouth 2 (two) times daily.  60 tablet  0   No current facility-administered medications for this visit.    Past Medical History  Diagnosis Date  . Coronary atherosclerosis of native coronary artery     a. s/p CABG   b. inferior STEMI 02/09/2014 LIMA to LAD patent, occluded SVG to RCA, 80% prox and distal seg SVG to OM1 and OM2. DES to SVG to RCA.  c. staged cath 7/9 DES x 2 to SVG to OM  . Renal insufficiency   . TIA (transient ischemic attack)     Questionable right middle cerebral artery event  . COPD (chronic obstructive pulmonary disease)   . Mixed hyperlipidemia   . Nephrolithiasis   . Skin cancer of nose   . Diverticulosis 2006  . Asbestos exposure     Followed in [place]    Past Surgical History  Procedure Laterality Date  . Coronary artery bypass graft  2006    LIMA to LAD, SVG to OM, SVG to OM, SVG to PDA    Social History Mr. Birge reports that he quit  smoking about 10 years ago. His smoking use included Cigarettes. He has a 25 pack-year smoking history. He quit smokeless tobacco use about 10 years ago. His smokeless tobacco use included Chew. Mr. Mcneese reports that he does not drink alcohol.  Review of Systems Other systems reviewed and negative except as outlined.  Physical Examination Filed Vitals:   04/28/14 1459  BP: 130/74  Pulse: 87   Filed Weights   04/28/14 1459  Weight: 199 lb 1.9 oz (90.32 kg)    Overweight male, comfortable at rest.  HEENT: Conjunctiva and lids normal, oropharynx clear.  Neck: Supple, no elevated JVP or carotid bruits, no thyromegaly.  Lungs: Course but clear to auscultation, nonlabored breathing at rest.  Cardiac: Regular rate and rhythm, no S3, 2/6 systolic murmur, no pericardial rub.  Abdomen:  Soft, nontender, bowel sounds present.  Extremities: No pitting edema, distal pulses 2+. Ecchymosis right groin and thigh.  Skin: Warm and dry.  Musculoskeletal: No kyphosis.  Neuropsychiatric: Alert and oriented x3, affect grossly appropriate.   Problem List and Plan   CORONARY ATHEROSCLEROSIS NATIVE CORONARY ARTERY Continue current medical regimen, plan to complete cardiac rehabilitation. We will schedule a followup in the next 3 months.  Cardiomyopathy, ischemic LVEF approximately 50% by recent echocardiogram with RV dysfunction. Form of Ebstein's was considered, but could also be secondary to RV involvement from his infarct.  Mixed hyperlipidemia He continues on Lipitor.    Satira Sark, M.D., F.A.C.C.

## 2014-04-29 ENCOUNTER — Encounter (HOSPITAL_COMMUNITY)
Admission: RE | Admit: 2014-04-29 | Discharge: 2014-04-29 | Disposition: A | Discharge status: Home / Self Care | Payer: Medicare Other | Source: Ambulatory Visit | Attending: Cardiology | Admitting: Cardiology

## 2014-04-29 DIAGNOSIS — I219 Acute myocardial infarction, unspecified: Secondary | ICD-10-CM | POA: Diagnosis not present

## 2014-04-29 LAB — GLUCOSE, CAPILLARY: GLUCOSE-CAPILLARY: 138 mg/dL — AB (ref 70–99)

## 2014-04-29 NOTE — Progress Notes (Signed)
Pt returned to exercise today after absence due to vertigo on Monday.  Pt is taking meclizine and tolerated exercise with no complaints. Continue to monitor. Cherre Huger, BSN

## 2014-05-01 ENCOUNTER — Encounter (HOSPITAL_COMMUNITY)
Admission: RE | Admit: 2014-05-01 | Discharge: 2014-05-01 | Disposition: A | Discharge status: Home / Self Care | Payer: Medicare Other | Source: Ambulatory Visit | Attending: Cardiology | Admitting: Cardiology

## 2014-05-01 DIAGNOSIS — I219 Acute myocardial infarction, unspecified: Secondary | ICD-10-CM | POA: Diagnosis not present

## 2014-05-04 ENCOUNTER — Encounter (HOSPITAL_COMMUNITY)
Admission: RE | Admit: 2014-05-04 | Discharge: 2014-05-04 | Disposition: A | Discharge status: Home / Self Care | Payer: Medicare Other | Source: Ambulatory Visit | Attending: Cardiology | Admitting: Cardiology

## 2014-05-04 DIAGNOSIS — I219 Acute myocardial infarction, unspecified: Secondary | ICD-10-CM | POA: Diagnosis not present

## 2014-05-06 ENCOUNTER — Encounter (HOSPITAL_COMMUNITY)
Admission: RE | Admit: 2014-05-06 | Discharge: 2014-05-06 | Disposition: A | Discharge status: Home / Self Care | Payer: Medicare Other | Source: Ambulatory Visit | Attending: Cardiology | Admitting: Cardiology

## 2014-05-06 DIAGNOSIS — I219 Acute myocardial infarction, unspecified: Secondary | ICD-10-CM | POA: Diagnosis not present

## 2014-05-08 ENCOUNTER — Encounter (HOSPITAL_COMMUNITY)
Admission: RE | Admit: 2014-05-08 | Discharge: 2014-05-08 | Disposition: A | Discharge status: Home / Self Care | Payer: Medicare Other | Source: Ambulatory Visit | Attending: Cardiology | Admitting: Cardiology

## 2014-05-08 DIAGNOSIS — Z955 Presence of coronary angioplasty implant and graft: Secondary | ICD-10-CM | POA: Diagnosis present

## 2014-05-08 DIAGNOSIS — I213 ST elevation (STEMI) myocardial infarction of unspecified site: Secondary | ICD-10-CM | POA: Insufficient documentation

## 2014-05-11 ENCOUNTER — Encounter (HOSPITAL_COMMUNITY)
Admission: RE | Admit: 2014-05-11 | Discharge: 2014-05-11 | Disposition: A | Discharge status: Home / Self Care | Payer: Medicare Other | Source: Ambulatory Visit | Attending: Cardiology | Admitting: Cardiology

## 2014-05-11 DIAGNOSIS — I213 ST elevation (STEMI) myocardial infarction of unspecified site: Secondary | ICD-10-CM | POA: Diagnosis not present

## 2014-05-11 LAB — GLUCOSE, CAPILLARY
Glucose-Capillary: 149 mg/dL — ABNORMAL HIGH (ref 70–99)
Glucose-Capillary: 124 mg/dL — ABNORMAL HIGH (ref 70–99)

## 2014-05-13 ENCOUNTER — Telehealth (HOSPITAL_COMMUNITY): Payer: Self-pay | Admitting: Internal Medicine

## 2014-05-13 ENCOUNTER — Encounter (HOSPITAL_COMMUNITY): Payer: Medicare Other

## 2014-05-15 ENCOUNTER — Encounter (HOSPITAL_COMMUNITY)
Admission: RE | Admit: 2014-05-15 | Discharge: 2014-05-15 | Disposition: A | Discharge status: Home / Self Care | Payer: Medicare Other | Source: Ambulatory Visit | Attending: Cardiology | Admitting: Cardiology

## 2014-05-15 DIAGNOSIS — I213 ST elevation (STEMI) myocardial infarction of unspecified site: Secondary | ICD-10-CM | POA: Diagnosis not present

## 2014-05-15 LAB — GLUCOSE, CAPILLARY: Glucose-Capillary: 183 mg/dL — ABNORMAL HIGH (ref 70–99)

## 2014-05-18 ENCOUNTER — Encounter (HOSPITAL_COMMUNITY)
Admission: RE | Admit: 2014-05-18 | Discharge: 2014-05-18 | Disposition: A | Discharge status: Home / Self Care | Payer: Medicare Other | Source: Ambulatory Visit | Attending: Cardiology | Admitting: Cardiology

## 2014-05-18 DIAGNOSIS — I213 ST elevation (STEMI) myocardial infarction of unspecified site: Secondary | ICD-10-CM | POA: Diagnosis not present

## 2014-05-20 ENCOUNTER — Encounter (HOSPITAL_COMMUNITY)
Admission: RE | Admit: 2014-05-20 | Discharge: 2014-05-20 | Disposition: A | Discharge status: Home / Self Care | Payer: Medicare Other | Source: Ambulatory Visit | Attending: Cardiology | Admitting: Cardiology

## 2014-05-20 DIAGNOSIS — I213 ST elevation (STEMI) myocardial infarction of unspecified site: Secondary | ICD-10-CM | POA: Diagnosis not present

## 2014-05-20 LAB — GLUCOSE, CAPILLARY: Glucose-Capillary: 116 mg/dL — ABNORMAL HIGH (ref 70–99)

## 2014-05-22 ENCOUNTER — Encounter (HOSPITAL_COMMUNITY)
Admission: RE | Admit: 2014-05-22 | Discharge: 2014-05-22 | Disposition: A | Discharge status: Home / Self Care | Payer: Medicare Other | Source: Ambulatory Visit | Attending: Cardiology | Admitting: Cardiology

## 2014-05-22 DIAGNOSIS — I213 ST elevation (STEMI) myocardial infarction of unspecified site: Secondary | ICD-10-CM | POA: Diagnosis not present

## 2014-05-25 ENCOUNTER — Encounter (HOSPITAL_COMMUNITY)
Admission: RE | Admit: 2014-05-25 | Discharge: 2014-05-25 | Disposition: A | Discharge status: Home / Self Care | Payer: Medicare Other | Source: Ambulatory Visit | Attending: Cardiology | Admitting: Cardiology

## 2014-05-25 DIAGNOSIS — I213 ST elevation (STEMI) myocardial infarction of unspecified site: Secondary | ICD-10-CM | POA: Diagnosis not present

## 2014-05-25 LAB — GLUCOSE, CAPILLARY
GLUCOSE-CAPILLARY: 125 mg/dL — AB (ref 70–99)
GLUCOSE-CAPILLARY: 143 mg/dL — AB (ref 70–99)

## 2014-05-27 ENCOUNTER — Encounter (HOSPITAL_COMMUNITY)
Admission: RE | Admit: 2014-05-27 | Discharge: 2014-05-27 | Disposition: A | Discharge status: Home / Self Care | Payer: Medicare Other | Source: Ambulatory Visit | Attending: Cardiology | Admitting: Cardiology

## 2014-05-27 DIAGNOSIS — I213 ST elevation (STEMI) myocardial infarction of unspecified site: Secondary | ICD-10-CM | POA: Diagnosis not present

## 2014-05-27 LAB — GLUCOSE, CAPILLARY: Glucose-Capillary: 123 mg/dL — ABNORMAL HIGH (ref 70–99)

## 2014-05-29 ENCOUNTER — Encounter (HOSPITAL_COMMUNITY)
Admission: RE | Admit: 2014-05-29 | Discharge: 2014-05-29 | Disposition: A | Discharge status: Home / Self Care | Payer: Medicare Other | Source: Ambulatory Visit | Attending: Cardiology | Admitting: Cardiology

## 2014-05-29 DIAGNOSIS — I213 ST elevation (STEMI) myocardial infarction of unspecified site: Secondary | ICD-10-CM | POA: Diagnosis not present

## 2014-06-01 ENCOUNTER — Encounter (HOSPITAL_COMMUNITY)
Admission: RE | Admit: 2014-06-01 | Discharge: 2014-06-01 | Disposition: A | Discharge status: Home / Self Care | Payer: Medicare Other | Source: Ambulatory Visit | Attending: Cardiology | Admitting: Cardiology

## 2014-06-01 DIAGNOSIS — I213 ST elevation (STEMI) myocardial infarction of unspecified site: Secondary | ICD-10-CM | POA: Diagnosis not present

## 2014-06-03 ENCOUNTER — Encounter (HOSPITAL_COMMUNITY)
Admission: RE | Admit: 2014-06-03 | Discharge: 2014-06-03 | Disposition: A | Discharge status: Home / Self Care | Payer: Medicare Other | Source: Ambulatory Visit | Attending: Cardiology | Admitting: Cardiology

## 2014-06-03 DIAGNOSIS — I213 ST elevation (STEMI) myocardial infarction of unspecified site: Secondary | ICD-10-CM | POA: Diagnosis not present

## 2014-06-03 NOTE — Progress Notes (Signed)
Pt graduates today with the completion of 36 exercise sessions.  Pt maintained excellent attendance to exercise as well as education classes.  Pt increased his MET level from 2.0 to 2.7.  Pt plans to continue home exercise with playing golf and [employer].  I anticipate he will be successful in this.  Medication list reconciled.  Pt verbalizes compliance with his medication regimen.  During his participation pt was able to decrease his usage of glipizide to as needed based upon his glucose readings.  Pt averages 2-3 x week requiring the medication due to dietary indiscretions.  Pt met her short term goal to be normal and increase stamina and endurance.  Pt plans to meet his long term goal when weather permits him to play golf. Repeat PHQ2 score 0.  Pt demonstrates a healthy and positive outlook and is very happy to [place]  Cherre Huger, BSN

## 2014-06-05 ENCOUNTER — Encounter (HOSPITAL_COMMUNITY): Payer: Medicare Other

## 2014-07-16 ENCOUNTER — Encounter (HOSPITAL_COMMUNITY): Payer: Self-pay | Admitting: Cardiovascular Disease

## 2014-07-28 ENCOUNTER — Encounter: Payer: Self-pay | Admitting: Cardiology

## 2014-07-28 ENCOUNTER — Ambulatory Visit (INDEPENDENT_AMBULATORY_CARE_PROVIDER_SITE_OTHER): Payer: Medicare Other | Admitting: Cardiology

## 2014-07-28 VITALS — BP 103/65 | HR 85 | Ht 70.0 in | Wt 197.0 lb

## 2014-07-28 DIAGNOSIS — E782 Mixed hyperlipidemia: Secondary | ICD-10-CM

## 2014-07-28 DIAGNOSIS — I2510 Atherosclerotic heart disease of native coronary artery without angina pectoris: Secondary | ICD-10-CM

## 2014-07-28 DIAGNOSIS — I255 Ischemic cardiomyopathy: Secondary | ICD-10-CM

## 2014-07-28 NOTE — Assessment & Plan Note (Signed)
Continues on Lipitor.

## 2014-07-28 NOTE — Progress Notes (Signed)
Reason for visit: CAD, hyperlipidemia  Clinical Summary [name] is an 80 y.o.male last seen in September. He is status post DES to the SVG to RCA and staged DES x2 to the SVG to OM system, LVEF approximately 50%. This is in the setting of an inferior STEMI with probable RV involvement in July. He did complete cardiac rehabilitation. Tells me that he was feeling better around the time that he was exercising regularly, has had a recent cold, and has not been walking as much. He has less energy. No angina symptoms however. He continues on aspirin, Brilinta, Lipitor, and Lopressor.  Lipid panel from July showed cholesterol 174, triglycerides 104, HDL 46, and LDL 107. He continues on Lipitor.  We discussed getting back to a walking regimen.  Allergies  Allergen Reactions  . Iodinated Diagnostic Agents Other (See Comments)    Causes intermediate diaphoresis and hypotension  . Statins Other (See Comments)    Tolerating well    Current Outpatient Prescriptions  Medication Sig Dispense Refill  . albuterol (PROVENTIL HFA;VENTOLIN HFA) 108 (90 BASE) MCG/ACT inhaler Inhale 2 puffs into the lungs every 6 (six) hours as needed for wheezing.    Marland Kitchen aspirin EC 81 MG tablet Take 81 mg by mouth every morning.     Marland Kitchen atorvastatin (LIPITOR) 10 MG tablet Take 1 tablet (10 mg total) by mouth daily at 6 PM. 30 tablet 12  . bismuth subsalicylate (PEPTO BISMOL) 262 MG/15ML suspension Take 30 mLs by mouth every 6 (six) hours as needed for diarrhea or loose stools.    Marland Kitchen glipiZIDE (GLUCOTROL XL) 2.5 MG 24 hr tablet Take 2.5 mg by mouth daily with breakfast.    . magnesium hydroxide (MILK OF MAGNESIA) 400 MG/5ML suspension Take by mouth daily as needed for mild constipation or moderate constipation.    . metoprolol tartrate (LOPRESSOR) 25 MG tablet Take 12.5 mg by mouth 2 (two) times daily.     . pantoprazole (PROTONIX) 40 MG tablet Take 40 mg by mouth daily.    . Tamsulosin HCl (FLOMAX) 0.4 MG CAPS Take 0.4 mg by  mouth daily after supper.     . ticagrelor (BRILINTA) 90 MG TABS tablet Take 1 tablet (90 mg total) by mouth 2 (two) times daily. 60 tablet 0   No current facility-administered medications for this visit.    Past Medical History  Diagnosis Date  . Coronary atherosclerosis of native coronary artery     a. s/p CABG   b. inferior STEMI 02/09/2014 LIMA to LAD patent, occluded SVG to RCA, 80% prox and distal seg SVG to OM1 and OM2. DES to SVG to RCA.  c. staged cath 7/9 DES x 2 to SVG to OM  . Renal insufficiency   . TIA (transient ischemic attack)     Questionable right middle cerebral artery event  . COPD (chronic obstructive pulmonary disease)   . Mixed hyperlipidemia   . Nephrolithiasis   . Skin cancer of nose   . Diverticulosis 2006  . Asbestos exposure     Followed in [place]    Past Surgical History  Procedure Laterality Date  . Coronary artery bypass graft  2006    LIMA to LAD, SVG to OM, SVG to OM, SVG to PDA  . Left heart catheterization with coronary angiogram Left 02/09/2014    Procedure: LEFT HEART CATHETERIZATION WITH CORONARY ANGIOGRAM;  Surgeon: Blane Ohara, MD;  Location: [facility] CATH LAB;  Service: Cardiovascular;  Laterality: Left;  . Percutaneous coronary stent  intervention (pci-s)  02/09/2014    Procedure: PERCUTANEOUS CORONARY STENT INTERVENTION (PCI-S);  Surgeon: Blane Ohara, MD;  Location: [facility] CATH LAB;  Service: Cardiovascular;;  . Temporary pacemaker insertion  02/09/2014    Procedure: TEMPORARY PACEMAKER INSERTION;  Surgeon: Blane Ohara, MD;  Location: [facility] CATH LAB;  Service: Cardiovascular;;  . Percutaneous coronary stent intervention (pci-s) N/A 02/12/2014    Procedure: PERCUTANEOUS CORONARY STENT INTERVENTION (PCI-S);  Surgeon: Peter M Martinique, MD;  Location: [facility] CATH LAB;  Service: Cardiovascular;  Laterality: N/A;    Social History [name] reports that he quit smoking about 10 years ago. His smoking use included Cigarettes. He started smoking about 66  years ago. He has a 25 pack-year smoking history. He quit smokeless tobacco use about 10 years ago. His smokeless tobacco use included Chew. [name] reports that he does not drink alcohol.  Review of Systems Complete review of systems negative except as otherwise outlined in the clinical summary and also the following. No palpitations or chest pain. No bleeding episodes.  Physical Examination Filed Vitals:   07/28/14 1409  BP: 103/65  Pulse: 85   Filed Weights   07/28/14 1409  Weight: 197 lb (89.359 kg)    Overweight male, comfortable at rest.  HEENT: Conjunctiva and lids normal, oropharynx clear.  Neck: Supple, no elevated JVP or carotid bruits, no thyromegaly.  Lungs: Course but clear to auscultation, nonlabored breathing at rest.  Cardiac: Regular rate and rhythm, no S3, 2/6 systolic murmur, no pericardial rub.  Abdomen: Soft, nontender, bowel sounds present.  Extremities: No pitting edema, distal pulses 2+. Ecchymosis right groin and thigh.  Skin: Warm and dry.  Musculoskeletal: No kyphosis.  Neuropsychiatric: Alert and oriented x3, affect grossly appropriate.   Problem List and Plan   CORONARY ATHEROSCLEROSIS NATIVE CORONARY ARTERY No active angina symptoms on medical therapy. I encouraged getting back to a regular exercise regimen. Follow-up in 4 months.  Mixed hyperlipidemia Continues on Lipitor.    Satira Sark, M.D., F.A.C.C.

## 2014-07-28 NOTE — Patient Instructions (Signed)
Your physician recommends that you schedule a follow-up appointment in: 4 months. You will receive a reminder letter in the mail in about 2 months reminding you to call and schedule your appointment. If you don't receive this letter, please contact our office. Your physician recommends that you continue on your current medications as directed. Please refer to the Current Medication list given to you today. 

## 2014-07-28 NOTE — Assessment & Plan Note (Signed)
No active angina symptoms on medical therapy. I encouraged getting back to a regular exercise regimen. Follow-up in 4 months.

## 2014-10-07 ENCOUNTER — Encounter (HOSPITAL_COMMUNITY)
Admission: RE | Admit: 2014-10-07 | Discharge: 2014-10-07 | Disposition: A | Discharge status: Home / Self Care | Payer: Self-pay | Source: Ambulatory Visit | Attending: Cardiology | Admitting: Cardiology

## 2014-10-07 DIAGNOSIS — Z955 Presence of coronary angioplasty implant and graft: Secondary | ICD-10-CM | POA: Insufficient documentation

## 2014-10-07 DIAGNOSIS — I252 Old myocardial infarction: Secondary | ICD-10-CM | POA: Insufficient documentation

## 2014-10-07 DIAGNOSIS — Z5189 Encounter for other specified aftercare: Secondary | ICD-10-CM | POA: Insufficient documentation

## 2014-10-09 ENCOUNTER — Encounter (HOSPITAL_COMMUNITY)
Admission: RE | Admit: 2014-10-09 | Discharge: 2014-10-09 | Disposition: A | Discharge status: Home / Self Care | Payer: Self-pay | Source: Ambulatory Visit | Attending: Cardiology | Admitting: Cardiology

## 2014-10-12 ENCOUNTER — Encounter (HOSPITAL_COMMUNITY)
Admission: RE | Admit: 2014-10-12 | Discharge: 2014-10-12 | Disposition: A | Discharge status: Home / Self Care | Payer: Self-pay | Source: Ambulatory Visit | Attending: Cardiology | Admitting: Cardiology

## 2014-10-14 ENCOUNTER — Encounter (HOSPITAL_COMMUNITY)
Admission: RE | Admit: 2014-10-14 | Discharge: 2014-10-14 | Disposition: A | Discharge status: Home / Self Care | Payer: Self-pay | Source: Ambulatory Visit | Attending: Cardiology | Admitting: Cardiology

## 2014-10-16 ENCOUNTER — Encounter (HOSPITAL_COMMUNITY): Payer: Self-pay

## 2014-10-19 ENCOUNTER — Encounter (HOSPITAL_COMMUNITY)
Admission: RE | Admit: 2014-10-19 | Discharge: 2014-10-19 | Disposition: A | Discharge status: Home / Self Care | Payer: Self-pay | Source: Ambulatory Visit | Attending: Cardiology | Admitting: Cardiology

## 2014-10-21 ENCOUNTER — Encounter (HOSPITAL_COMMUNITY)
Admission: RE | Admit: 2014-10-21 | Discharge: 2014-10-21 | Disposition: A | Discharge status: Home / Self Care | Payer: Self-pay | Source: Ambulatory Visit | Attending: Cardiology | Admitting: Cardiology

## 2014-10-23 ENCOUNTER — Encounter (HOSPITAL_COMMUNITY)
Admission: RE | Admit: 2014-10-23 | Discharge: 2014-10-23 | Disposition: A | Discharge status: Home / Self Care | Payer: Self-pay | Source: Ambulatory Visit | Attending: Cardiology | Admitting: Cardiology

## 2014-10-26 ENCOUNTER — Encounter (HOSPITAL_COMMUNITY): Payer: Self-pay

## 2014-10-28 ENCOUNTER — Encounter (HOSPITAL_COMMUNITY): Payer: Self-pay

## 2014-10-30 ENCOUNTER — Encounter (HOSPITAL_COMMUNITY): Payer: Self-pay

## 2014-11-02 ENCOUNTER — Encounter (HOSPITAL_COMMUNITY): Payer: Self-pay

## 2014-11-04 ENCOUNTER — Encounter (HOSPITAL_COMMUNITY): Payer: Self-pay

## 2014-11-06 ENCOUNTER — Encounter (HOSPITAL_COMMUNITY): Payer: Self-pay

## 2014-11-09 ENCOUNTER — Encounter (HOSPITAL_COMMUNITY): Payer: Self-pay

## 2014-11-11 ENCOUNTER — Encounter (HOSPITAL_COMMUNITY): Payer: Self-pay

## 2014-11-13 ENCOUNTER — Encounter (HOSPITAL_COMMUNITY): Payer: Self-pay

## 2014-11-16 ENCOUNTER — Encounter (HOSPITAL_COMMUNITY): Payer: Self-pay

## 2014-11-18 ENCOUNTER — Encounter (HOSPITAL_COMMUNITY): Payer: Self-pay

## 2014-11-20 ENCOUNTER — Encounter (HOSPITAL_COMMUNITY): Payer: Self-pay

## 2014-11-23 ENCOUNTER — Encounter (HOSPITAL_COMMUNITY): Payer: Self-pay

## 2014-11-25 ENCOUNTER — Encounter (HOSPITAL_COMMUNITY): Payer: Self-pay

## 2014-11-27 ENCOUNTER — Encounter (HOSPITAL_COMMUNITY): Payer: Self-pay

## 2014-11-30 ENCOUNTER — Encounter (HOSPITAL_COMMUNITY): Payer: Self-pay

## 2014-12-02 ENCOUNTER — Encounter (HOSPITAL_COMMUNITY): Payer: Self-pay

## 2014-12-04 ENCOUNTER — Encounter (HOSPITAL_COMMUNITY): Payer: Self-pay

## 2014-12-07 ENCOUNTER — Encounter (HOSPITAL_COMMUNITY): Payer: Self-pay

## 2014-12-09 ENCOUNTER — Encounter (HOSPITAL_COMMUNITY): Payer: Self-pay

## 2014-12-11 ENCOUNTER — Encounter (HOSPITAL_COMMUNITY): Payer: Self-pay

## 2014-12-14 ENCOUNTER — Encounter (HOSPITAL_COMMUNITY): Payer: Self-pay

## 2014-12-16 ENCOUNTER — Encounter (HOSPITAL_COMMUNITY): Payer: Self-pay

## 2014-12-18 ENCOUNTER — Encounter (HOSPITAL_COMMUNITY): Payer: Self-pay

## 2014-12-21 ENCOUNTER — Encounter (HOSPITAL_COMMUNITY): Payer: Self-pay

## 2014-12-23 ENCOUNTER — Encounter (HOSPITAL_COMMUNITY): Payer: Self-pay

## 2014-12-25 ENCOUNTER — Encounter (HOSPITAL_COMMUNITY): Payer: Self-pay

## 2014-12-28 ENCOUNTER — Encounter (HOSPITAL_COMMUNITY): Payer: Self-pay

## 2014-12-30 ENCOUNTER — Encounter (HOSPITAL_COMMUNITY): Payer: Self-pay

## 2015-01-01 ENCOUNTER — Encounter (HOSPITAL_COMMUNITY): Payer: Self-pay

## 2015-01-03 IMAGING — CR DG CHEST 1V PORT
1 series · 1 of 1 positions shown · non-contrast
Comparison: Yesterday

CLINICAL DATA: Weakness

EXAM:
PORTABLE CHEST - 1 VIEW

[portable]
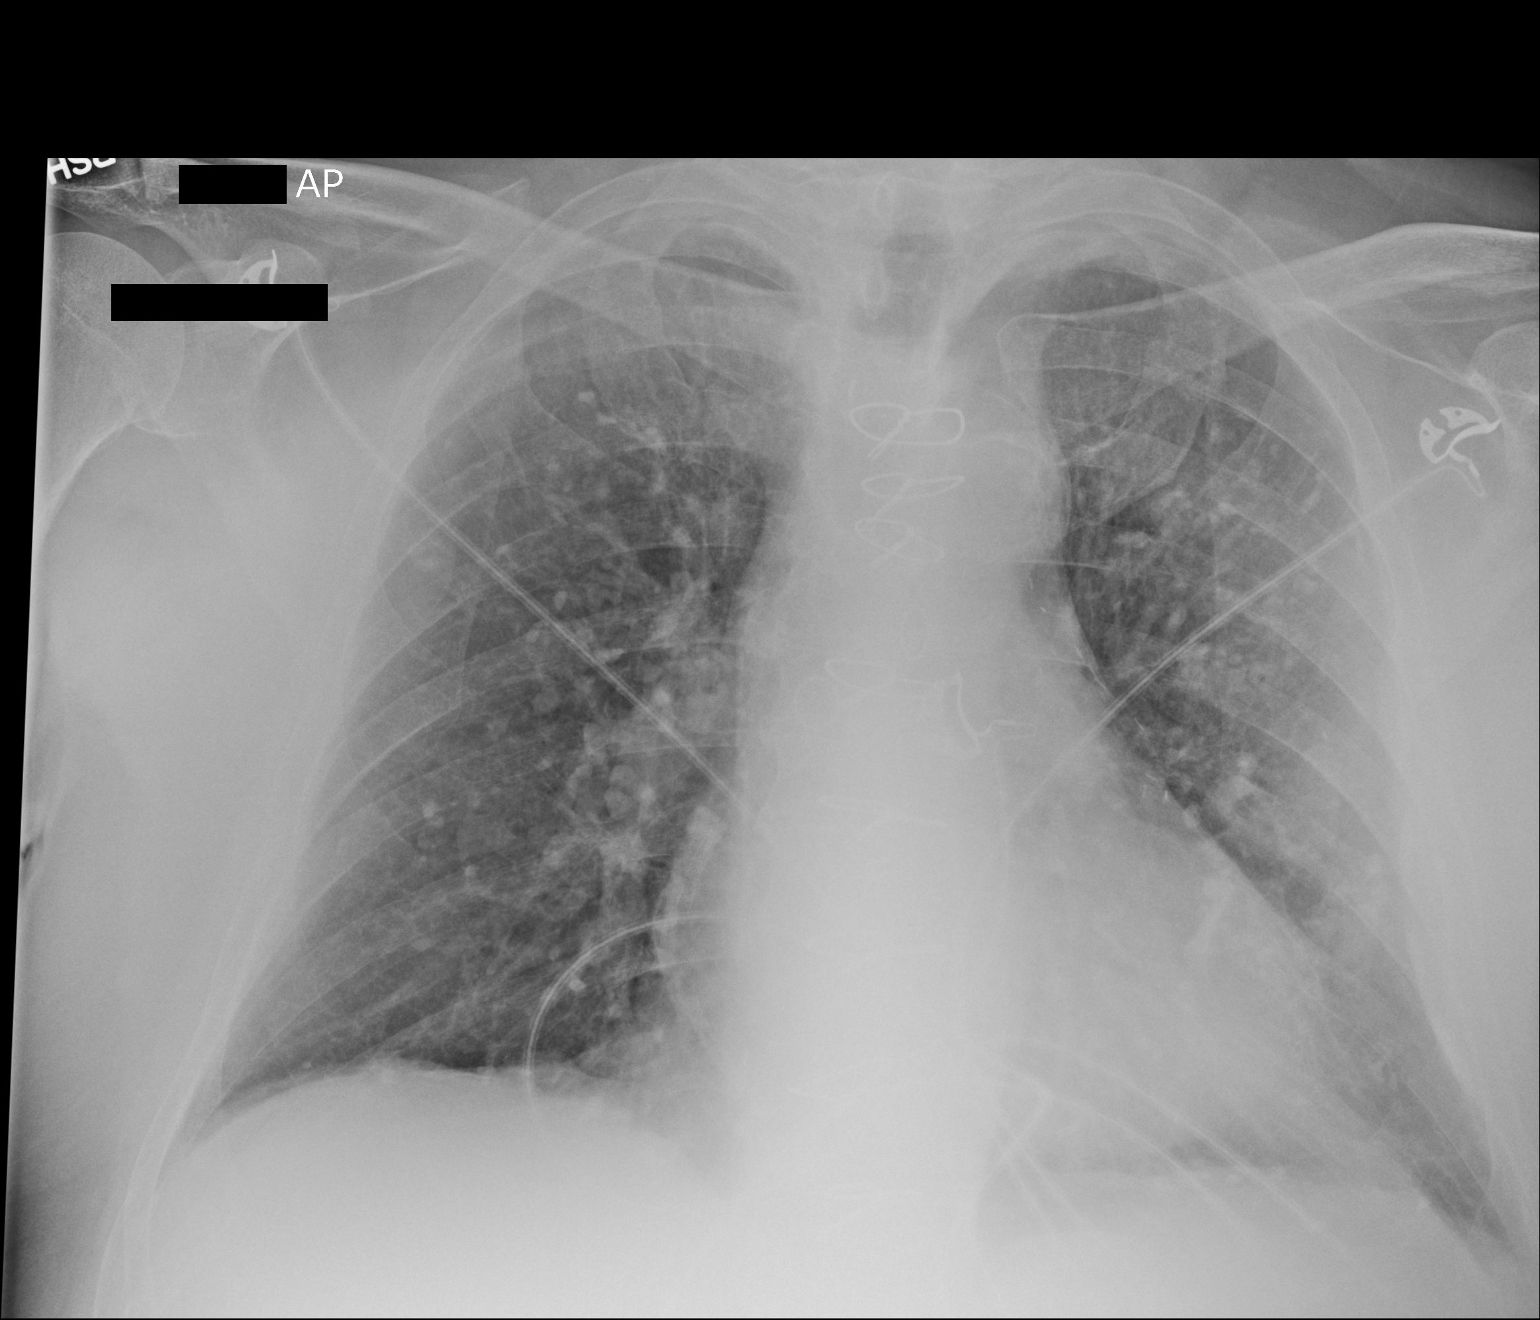

[1 of 1 positions shown; findings below may reference images not displayed]

FINDINGS: Upper normal heart size. Endotracheal and NG tubes have been
removed. Improved aeration and basilar atelectasis. Calcified
densities throughout both lungs are stable. No pneumothorax.
IMPRESSION: Extubated.  Improved aeration.

## 2015-01-06 ENCOUNTER — Encounter (HOSPITAL_COMMUNITY): Payer: Self-pay

## 2015-01-08 ENCOUNTER — Encounter (HOSPITAL_COMMUNITY): Payer: Self-pay

## 2015-01-11 ENCOUNTER — Encounter (HOSPITAL_COMMUNITY): Payer: Self-pay

## 2015-01-13 ENCOUNTER — Encounter (HOSPITAL_COMMUNITY): Payer: Self-pay

## 2015-01-15 ENCOUNTER — Encounter (HOSPITAL_COMMUNITY): Payer: Self-pay

## 2015-01-18 ENCOUNTER — Encounter (HOSPITAL_COMMUNITY): Payer: Self-pay

## 2015-01-20 ENCOUNTER — Encounter (HOSPITAL_COMMUNITY): Payer: Self-pay

## 2015-01-22 ENCOUNTER — Encounter (HOSPITAL_COMMUNITY): Payer: Self-pay

## 2015-01-25 ENCOUNTER — Encounter (HOSPITAL_COMMUNITY): Payer: Self-pay

## 2015-01-27 ENCOUNTER — Encounter (HOSPITAL_COMMUNITY): Payer: Self-pay

## 2015-01-29 ENCOUNTER — Encounter (HOSPITAL_COMMUNITY): Payer: Self-pay

## 2015-02-01 ENCOUNTER — Encounter (HOSPITAL_COMMUNITY): Payer: Self-pay

## 2015-02-03 ENCOUNTER — Encounter (HOSPITAL_COMMUNITY): Payer: Self-pay

## 2015-02-05 ENCOUNTER — Encounter (HOSPITAL_COMMUNITY): Payer: Self-pay

## 2015-02-10 ENCOUNTER — Encounter (HOSPITAL_COMMUNITY): Payer: Self-pay

## 2015-02-12 ENCOUNTER — Encounter (HOSPITAL_COMMUNITY): Payer: Self-pay

## 2015-02-15 ENCOUNTER — Encounter (HOSPITAL_COMMUNITY): Payer: Self-pay

## 2015-02-17 ENCOUNTER — Encounter (HOSPITAL_COMMUNITY): Payer: Self-pay

## 2015-02-19 ENCOUNTER — Encounter (HOSPITAL_COMMUNITY): Payer: Self-pay

## 2015-02-22 ENCOUNTER — Encounter (HOSPITAL_COMMUNITY): Payer: Self-pay

## 2015-02-24 ENCOUNTER — Encounter (HOSPITAL_COMMUNITY): Payer: Self-pay

## 2015-02-26 ENCOUNTER — Encounter (HOSPITAL_COMMUNITY): Payer: Self-pay

## 2015-03-01 ENCOUNTER — Encounter (HOSPITAL_COMMUNITY): Payer: Self-pay

## 2015-03-03 ENCOUNTER — Encounter (HOSPITAL_COMMUNITY): Payer: Self-pay

## 2015-03-05 ENCOUNTER — Encounter (HOSPITAL_COMMUNITY): Payer: Self-pay

## 2015-08-17 DIAGNOSIS — I2510 Atherosclerotic heart disease of native coronary artery without angina pectoris: Secondary | ICD-10-CM | POA: Diagnosis not present

## 2015-08-17 DIAGNOSIS — E1122 Type 2 diabetes mellitus with diabetic chronic kidney disease: Secondary | ICD-10-CM | POA: Diagnosis not present

## 2015-08-17 DIAGNOSIS — Z6829 Body mass index (BMI) 29.0-29.9, adult: Secondary | ICD-10-CM | POA: Diagnosis not present

## 2015-08-17 DIAGNOSIS — I10 Essential (primary) hypertension: Secondary | ICD-10-CM | POA: Diagnosis not present

## 2015-08-17 DIAGNOSIS — J449 Chronic obstructive pulmonary disease, unspecified: Secondary | ICD-10-CM | POA: Diagnosis not present

## 2015-08-27 DIAGNOSIS — J449 Chronic obstructive pulmonary disease, unspecified: Secondary | ICD-10-CM | POA: Diagnosis not present

## 2015-08-27 DIAGNOSIS — I10 Essential (primary) hypertension: Secondary | ICD-10-CM | POA: Diagnosis not present

## 2015-08-27 DIAGNOSIS — E1122 Type 2 diabetes mellitus with diabetic chronic kidney disease: Secondary | ICD-10-CM | POA: Diagnosis not present

## 2015-11-25 DIAGNOSIS — I10 Essential (primary) hypertension: Secondary | ICD-10-CM | POA: Diagnosis not present

## 2015-11-25 DIAGNOSIS — E119 Type 2 diabetes mellitus without complications: Secondary | ICD-10-CM | POA: Diagnosis not present

## 2015-11-25 DIAGNOSIS — I2510 Atherosclerotic heart disease of native coronary artery without angina pectoris: Secondary | ICD-10-CM | POA: Diagnosis not present

## 2015-12-02 DIAGNOSIS — I2510 Atherosclerotic heart disease of native coronary artery without angina pectoris: Secondary | ICD-10-CM | POA: Diagnosis not present

## 2015-12-02 DIAGNOSIS — Z87891 Personal history of nicotine dependence: Secondary | ICD-10-CM | POA: Diagnosis not present

## 2015-12-02 DIAGNOSIS — J449 Chronic obstructive pulmonary disease, unspecified: Secondary | ICD-10-CM | POA: Diagnosis not present

## 2015-12-02 DIAGNOSIS — I10 Essential (primary) hypertension: Secondary | ICD-10-CM | POA: Diagnosis not present

## 2015-12-02 DIAGNOSIS — E1122 Type 2 diabetes mellitus with diabetic chronic kidney disease: Secondary | ICD-10-CM | POA: Diagnosis not present

## 2015-12-15 DIAGNOSIS — E119 Type 2 diabetes mellitus without complications: Secondary | ICD-10-CM | POA: Diagnosis not present

## 2015-12-15 DIAGNOSIS — J449 Chronic obstructive pulmonary disease, unspecified: Secondary | ICD-10-CM | POA: Diagnosis not present

## 2015-12-15 DIAGNOSIS — I10 Essential (primary) hypertension: Secondary | ICD-10-CM | POA: Diagnosis not present

## 2015-12-15 DIAGNOSIS — I2510 Atherosclerotic heart disease of native coronary artery without angina pectoris: Secondary | ICD-10-CM | POA: Diagnosis not present

## 2016-02-21 DIAGNOSIS — E119 Type 2 diabetes mellitus without complications: Secondary | ICD-10-CM | POA: Diagnosis not present

## 2016-02-21 DIAGNOSIS — I2510 Atherosclerotic heart disease of native coronary artery without angina pectoris: Secondary | ICD-10-CM | POA: Diagnosis not present

## 2016-02-21 DIAGNOSIS — I10 Essential (primary) hypertension: Secondary | ICD-10-CM | POA: Diagnosis not present

## 2016-02-21 DIAGNOSIS — J449 Chronic obstructive pulmonary disease, unspecified: Secondary | ICD-10-CM | POA: Diagnosis not present

## 2016-03-08 DIAGNOSIS — L723 Sebaceous cyst: Secondary | ICD-10-CM | POA: Diagnosis not present

## 2016-03-08 DIAGNOSIS — E1122 Type 2 diabetes mellitus with diabetic chronic kidney disease: Secondary | ICD-10-CM | POA: Diagnosis not present

## 2016-03-08 DIAGNOSIS — J449 Chronic obstructive pulmonary disease, unspecified: Secondary | ICD-10-CM | POA: Diagnosis not present

## 2016-03-08 DIAGNOSIS — I10 Essential (primary) hypertension: Secondary | ICD-10-CM | POA: Diagnosis not present

## 2016-03-20 DIAGNOSIS — D485 Neoplasm of uncertain behavior of skin: Secondary | ICD-10-CM | POA: Diagnosis not present

## 2016-03-20 DIAGNOSIS — Z85828 Personal history of other malignant neoplasm of skin: Secondary | ICD-10-CM | POA: Diagnosis not present

## 2016-03-20 DIAGNOSIS — C44629 Squamous cell carcinoma of skin of left upper limb, including shoulder: Secondary | ICD-10-CM | POA: Diagnosis not present

## 2016-03-30 DIAGNOSIS — C44629 Squamous cell carcinoma of skin of left upper limb, including shoulder: Secondary | ICD-10-CM | POA: Diagnosis not present

## 2016-05-04 DIAGNOSIS — I10 Essential (primary) hypertension: Secondary | ICD-10-CM | POA: Diagnosis not present

## 2016-05-04 DIAGNOSIS — I2510 Atherosclerotic heart disease of native coronary artery without angina pectoris: Secondary | ICD-10-CM | POA: Diagnosis not present

## 2016-05-04 DIAGNOSIS — E119 Type 2 diabetes mellitus without complications: Secondary | ICD-10-CM | POA: Diagnosis not present

## 2016-05-04 DIAGNOSIS — J449 Chronic obstructive pulmonary disease, unspecified: Secondary | ICD-10-CM | POA: Diagnosis not present

## 2016-05-09 DIAGNOSIS — Z23 Encounter for immunization: Secondary | ICD-10-CM | POA: Diagnosis not present

## 2016-05-26 DIAGNOSIS — Z1211 Encounter for screening for malignant neoplasm of colon: Secondary | ICD-10-CM | POA: Diagnosis not present

## 2016-05-26 DIAGNOSIS — Z79899 Other long term (current) drug therapy: Secondary | ICD-10-CM | POA: Diagnosis not present

## 2016-05-26 DIAGNOSIS — Z0000 Encounter for general adult medical examination without abnormal findings: Secondary | ICD-10-CM | POA: Diagnosis not present

## 2016-05-26 DIAGNOSIS — Z7189 Other specified counseling: Secondary | ICD-10-CM | POA: Diagnosis not present

## 2016-05-26 DIAGNOSIS — R5383 Other fatigue: Secondary | ICD-10-CM | POA: Diagnosis not present

## 2016-05-26 DIAGNOSIS — Z125 Encounter for screening for malignant neoplasm of prostate: Secondary | ICD-10-CM | POA: Diagnosis not present

## 2016-05-26 DIAGNOSIS — Z6829 Body mass index (BMI) 29.0-29.9, adult: Secondary | ICD-10-CM | POA: Diagnosis not present

## 2016-05-26 DIAGNOSIS — Z299 Encounter for prophylactic measures, unspecified: Secondary | ICD-10-CM | POA: Diagnosis not present

## 2016-05-26 DIAGNOSIS — E7800 Pure hypercholesterolemia, unspecified: Secondary | ICD-10-CM | POA: Diagnosis not present

## 2016-05-26 DIAGNOSIS — Z1389 Encounter for screening for other disorder: Secondary | ICD-10-CM | POA: Diagnosis not present

## 2016-07-04 DIAGNOSIS — J449 Chronic obstructive pulmonary disease, unspecified: Secondary | ICD-10-CM | POA: Diagnosis not present

## 2016-07-04 DIAGNOSIS — I2510 Atherosclerotic heart disease of native coronary artery without angina pectoris: Secondary | ICD-10-CM | POA: Diagnosis not present

## 2016-07-04 DIAGNOSIS — Z299 Encounter for prophylactic measures, unspecified: Secondary | ICD-10-CM | POA: Diagnosis not present

## 2016-07-04 DIAGNOSIS — I10 Essential (primary) hypertension: Secondary | ICD-10-CM | POA: Diagnosis not present

## 2016-07-04 DIAGNOSIS — E1122 Type 2 diabetes mellitus with diabetic chronic kidney disease: Secondary | ICD-10-CM | POA: Diagnosis not present

## 2016-07-05 DIAGNOSIS — I10 Essential (primary) hypertension: Secondary | ICD-10-CM | POA: Diagnosis not present

## 2016-07-05 DIAGNOSIS — I2510 Atherosclerotic heart disease of native coronary artery without angina pectoris: Secondary | ICD-10-CM | POA: Diagnosis not present

## 2016-07-05 DIAGNOSIS — E119 Type 2 diabetes mellitus without complications: Secondary | ICD-10-CM | POA: Diagnosis not present

## 2016-07-05 DIAGNOSIS — J449 Chronic obstructive pulmonary disease, unspecified: Secondary | ICD-10-CM | POA: Diagnosis not present

## 2016-08-02 DIAGNOSIS — J449 Chronic obstructive pulmonary disease, unspecified: Secondary | ICD-10-CM | POA: Diagnosis not present

## 2016-08-02 DIAGNOSIS — I10 Essential (primary) hypertension: Secondary | ICD-10-CM | POA: Diagnosis not present

## 2016-08-02 DIAGNOSIS — I2510 Atherosclerotic heart disease of native coronary artery without angina pectoris: Secondary | ICD-10-CM | POA: Diagnosis not present

## 2016-08-02 DIAGNOSIS — E119 Type 2 diabetes mellitus without complications: Secondary | ICD-10-CM | POA: Diagnosis not present

## 2016-10-02 DIAGNOSIS — L570 Actinic keratosis: Secondary | ICD-10-CM | POA: Diagnosis not present

## 2016-10-23 DIAGNOSIS — I429 Cardiomyopathy, unspecified: Secondary | ICD-10-CM | POA: Diagnosis not present

## 2016-10-23 DIAGNOSIS — I10 Essential (primary) hypertension: Secondary | ICD-10-CM | POA: Diagnosis not present

## 2016-10-23 DIAGNOSIS — N183 Chronic kidney disease, stage 3 (moderate): Secondary | ICD-10-CM | POA: Diagnosis not present

## 2016-10-23 DIAGNOSIS — J61 Pneumoconiosis due to asbestos and other mineral fibers: Secondary | ICD-10-CM | POA: Diagnosis not present

## 2016-10-23 DIAGNOSIS — Z6828 Body mass index (BMI) 28.0-28.9, adult: Secondary | ICD-10-CM | POA: Diagnosis not present

## 2016-10-23 DIAGNOSIS — F419 Anxiety disorder, unspecified: Secondary | ICD-10-CM | POA: Diagnosis not present

## 2016-10-23 DIAGNOSIS — I2510 Atherosclerotic heart disease of native coronary artery without angina pectoris: Secondary | ICD-10-CM | POA: Diagnosis not present

## 2016-10-23 DIAGNOSIS — E7800 Pure hypercholesterolemia, unspecified: Secondary | ICD-10-CM | POA: Diagnosis not present

## 2016-10-23 DIAGNOSIS — E1159 Type 2 diabetes mellitus with other circulatory complications: Secondary | ICD-10-CM | POA: Diagnosis not present

## 2016-10-23 DIAGNOSIS — E1122 Type 2 diabetes mellitus with diabetic chronic kidney disease: Secondary | ICD-10-CM | POA: Diagnosis not present

## 2016-10-23 DIAGNOSIS — J449 Chronic obstructive pulmonary disease, unspecified: Secondary | ICD-10-CM | POA: Diagnosis not present

## 2016-10-23 DIAGNOSIS — Z299 Encounter for prophylactic measures, unspecified: Secondary | ICD-10-CM | POA: Diagnosis not present

## 2016-11-21 DIAGNOSIS — L280 Lichen simplex chronicus: Secondary | ICD-10-CM | POA: Diagnosis not present

## 2016-11-21 DIAGNOSIS — L570 Actinic keratosis: Secondary | ICD-10-CM | POA: Diagnosis not present

## 2016-11-24 DIAGNOSIS — I2510 Atherosclerotic heart disease of native coronary artery without angina pectoris: Secondary | ICD-10-CM | POA: Diagnosis not present

## 2016-11-24 DIAGNOSIS — E119 Type 2 diabetes mellitus without complications: Secondary | ICD-10-CM | POA: Diagnosis not present

## 2016-11-24 DIAGNOSIS — J449 Chronic obstructive pulmonary disease, unspecified: Secondary | ICD-10-CM | POA: Diagnosis not present

## 2016-11-24 DIAGNOSIS — I10 Essential (primary) hypertension: Secondary | ICD-10-CM | POA: Diagnosis not present

## 2016-11-30 DIAGNOSIS — J329 Chronic sinusitis, unspecified: Secondary | ICD-10-CM | POA: Diagnosis not present

## 2016-11-30 DIAGNOSIS — Z299 Encounter for prophylactic measures, unspecified: Secondary | ICD-10-CM | POA: Diagnosis not present

## 2016-11-30 DIAGNOSIS — Z713 Dietary counseling and surveillance: Secondary | ICD-10-CM | POA: Diagnosis not present

## 2016-11-30 DIAGNOSIS — Z6829 Body mass index (BMI) 29.0-29.9, adult: Secondary | ICD-10-CM | POA: Diagnosis not present

## 2016-12-05 DIAGNOSIS — J209 Acute bronchitis, unspecified: Secondary | ICD-10-CM | POA: Diagnosis not present

## 2016-12-05 DIAGNOSIS — J441 Chronic obstructive pulmonary disease with (acute) exacerbation: Secondary | ICD-10-CM | POA: Diagnosis not present

## 2016-12-05 DIAGNOSIS — J440 Chronic obstructive pulmonary disease with acute lower respiratory infection: Secondary | ICD-10-CM | POA: Diagnosis not present

## 2016-12-05 DIAGNOSIS — Z8673 Personal history of transient ischemic attack (TIA), and cerebral infarction without residual deficits: Secondary | ICD-10-CM | POA: Diagnosis not present

## 2016-12-05 DIAGNOSIS — I252 Old myocardial infarction: Secondary | ICD-10-CM | POA: Diagnosis not present

## 2016-12-05 DIAGNOSIS — Z87891 Personal history of nicotine dependence: Secondary | ICD-10-CM | POA: Diagnosis not present

## 2016-12-05 DIAGNOSIS — R079 Chest pain, unspecified: Secondary | ICD-10-CM | POA: Diagnosis not present

## 2016-12-05 DIAGNOSIS — R0602 Shortness of breath: Secondary | ICD-10-CM | POA: Diagnosis not present

## 2016-12-05 DIAGNOSIS — R05 Cough: Secondary | ICD-10-CM | POA: Diagnosis not present

## 2017-01-04 DIAGNOSIS — I10 Essential (primary) hypertension: Secondary | ICD-10-CM | POA: Diagnosis not present

## 2017-01-04 DIAGNOSIS — E119 Type 2 diabetes mellitus without complications: Secondary | ICD-10-CM | POA: Diagnosis not present

## 2017-01-04 DIAGNOSIS — I2510 Atherosclerotic heart disease of native coronary artery without angina pectoris: Secondary | ICD-10-CM | POA: Diagnosis not present

## 2017-01-04 DIAGNOSIS — J449 Chronic obstructive pulmonary disease, unspecified: Secondary | ICD-10-CM | POA: Diagnosis not present

## 2017-01-23 DIAGNOSIS — J449 Chronic obstructive pulmonary disease, unspecified: Secondary | ICD-10-CM | POA: Diagnosis not present

## 2017-01-23 DIAGNOSIS — E119 Type 2 diabetes mellitus without complications: Secondary | ICD-10-CM | POA: Diagnosis not present

## 2017-01-23 DIAGNOSIS — I10 Essential (primary) hypertension: Secondary | ICD-10-CM | POA: Diagnosis not present

## 2017-01-23 DIAGNOSIS — I2510 Atherosclerotic heart disease of native coronary artery without angina pectoris: Secondary | ICD-10-CM | POA: Diagnosis not present

## 2017-01-29 DIAGNOSIS — I10 Essential (primary) hypertension: Secondary | ICD-10-CM | POA: Diagnosis not present

## 2017-01-29 DIAGNOSIS — E1122 Type 2 diabetes mellitus with diabetic chronic kidney disease: Secondary | ICD-10-CM | POA: Diagnosis not present

## 2017-01-29 DIAGNOSIS — N400 Enlarged prostate without lower urinary tract symptoms: Secondary | ICD-10-CM | POA: Diagnosis not present

## 2017-01-29 DIAGNOSIS — Z299 Encounter for prophylactic measures, unspecified: Secondary | ICD-10-CM | POA: Diagnosis not present

## 2017-01-29 DIAGNOSIS — Z6828 Body mass index (BMI) 28.0-28.9, adult: Secondary | ICD-10-CM | POA: Diagnosis not present

## 2017-01-29 DIAGNOSIS — N183 Chronic kidney disease, stage 3 (moderate): Secondary | ICD-10-CM | POA: Diagnosis not present

## 2017-01-29 DIAGNOSIS — I2510 Atherosclerotic heart disease of native coronary artery without angina pectoris: Secondary | ICD-10-CM | POA: Diagnosis not present

## 2017-01-29 DIAGNOSIS — J449 Chronic obstructive pulmonary disease, unspecified: Secondary | ICD-10-CM | POA: Diagnosis not present

## 2017-01-29 DIAGNOSIS — E7800 Pure hypercholesterolemia, unspecified: Secondary | ICD-10-CM | POA: Diagnosis not present

## 2017-01-29 DIAGNOSIS — I429 Cardiomyopathy, unspecified: Secondary | ICD-10-CM | POA: Diagnosis not present

## 2017-01-29 DIAGNOSIS — I6529 Occlusion and stenosis of unspecified carotid artery: Secondary | ICD-10-CM | POA: Diagnosis not present

## 2017-03-26 DIAGNOSIS — D485 Neoplasm of uncertain behavior of skin: Secondary | ICD-10-CM | POA: Diagnosis not present

## 2017-03-26 DIAGNOSIS — L280 Lichen simplex chronicus: Secondary | ICD-10-CM | POA: Diagnosis not present

## 2017-03-26 DIAGNOSIS — Z85828 Personal history of other malignant neoplasm of skin: Secondary | ICD-10-CM | POA: Diagnosis not present

## 2017-03-26 DIAGNOSIS — L570 Actinic keratosis: Secondary | ICD-10-CM | POA: Diagnosis not present

## 2017-03-30 DIAGNOSIS — I10 Essential (primary) hypertension: Secondary | ICD-10-CM | POA: Diagnosis not present

## 2017-03-30 DIAGNOSIS — J449 Chronic obstructive pulmonary disease, unspecified: Secondary | ICD-10-CM | POA: Diagnosis not present

## 2017-03-30 DIAGNOSIS — E119 Type 2 diabetes mellitus without complications: Secondary | ICD-10-CM | POA: Diagnosis not present

## 2017-03-30 DIAGNOSIS — I2510 Atherosclerotic heart disease of native coronary artery without angina pectoris: Secondary | ICD-10-CM | POA: Diagnosis not present

## 2017-04-20 DIAGNOSIS — E119 Type 2 diabetes mellitus without complications: Secondary | ICD-10-CM | POA: Diagnosis not present

## 2017-04-20 DIAGNOSIS — J449 Chronic obstructive pulmonary disease, unspecified: Secondary | ICD-10-CM | POA: Diagnosis not present

## 2017-04-20 DIAGNOSIS — I10 Essential (primary) hypertension: Secondary | ICD-10-CM | POA: Diagnosis not present

## 2017-04-20 DIAGNOSIS — I2510 Atherosclerotic heart disease of native coronary artery without angina pectoris: Secondary | ICD-10-CM | POA: Diagnosis not present

## 2017-05-07 DIAGNOSIS — I429 Cardiomyopathy, unspecified: Secondary | ICD-10-CM | POA: Diagnosis not present

## 2017-05-07 DIAGNOSIS — E1122 Type 2 diabetes mellitus with diabetic chronic kidney disease: Secondary | ICD-10-CM | POA: Diagnosis not present

## 2017-05-07 DIAGNOSIS — N183 Chronic kidney disease, stage 3 (moderate): Secondary | ICD-10-CM | POA: Diagnosis not present

## 2017-05-07 DIAGNOSIS — Z299 Encounter for prophylactic measures, unspecified: Secondary | ICD-10-CM | POA: Diagnosis not present

## 2017-05-07 DIAGNOSIS — I6529 Occlusion and stenosis of unspecified carotid artery: Secondary | ICD-10-CM | POA: Diagnosis not present

## 2017-05-07 DIAGNOSIS — I10 Essential (primary) hypertension: Secondary | ICD-10-CM | POA: Diagnosis not present

## 2017-05-07 DIAGNOSIS — N400 Enlarged prostate without lower urinary tract symptoms: Secondary | ICD-10-CM | POA: Diagnosis not present

## 2017-05-07 DIAGNOSIS — J449 Chronic obstructive pulmonary disease, unspecified: Secondary | ICD-10-CM | POA: Diagnosis not present

## 2017-05-07 DIAGNOSIS — E1165 Type 2 diabetes mellitus with hyperglycemia: Secondary | ICD-10-CM | POA: Diagnosis not present

## 2017-05-07 DIAGNOSIS — Z6828 Body mass index (BMI) 28.0-28.9, adult: Secondary | ICD-10-CM | POA: Diagnosis not present

## 2017-05-07 DIAGNOSIS — I2510 Atherosclerotic heart disease of native coronary artery without angina pectoris: Secondary | ICD-10-CM | POA: Diagnosis not present

## 2017-05-07 DIAGNOSIS — E7800 Pure hypercholesterolemia, unspecified: Secondary | ICD-10-CM | POA: Diagnosis not present

## 2017-05-14 DIAGNOSIS — J449 Chronic obstructive pulmonary disease, unspecified: Secondary | ICD-10-CM | POA: Diagnosis not present

## 2017-05-14 DIAGNOSIS — I2510 Atherosclerotic heart disease of native coronary artery without angina pectoris: Secondary | ICD-10-CM | POA: Diagnosis not present

## 2017-05-14 DIAGNOSIS — I10 Essential (primary) hypertension: Secondary | ICD-10-CM | POA: Diagnosis not present

## 2017-05-14 DIAGNOSIS — E119 Type 2 diabetes mellitus without complications: Secondary | ICD-10-CM | POA: Diagnosis not present

## 2017-05-18 DIAGNOSIS — Z23 Encounter for immunization: Secondary | ICD-10-CM | POA: Diagnosis not present

## 2017-07-20 DIAGNOSIS — I2510 Atherosclerotic heart disease of native coronary artery without angina pectoris: Secondary | ICD-10-CM | POA: Diagnosis not present

## 2017-07-20 DIAGNOSIS — J449 Chronic obstructive pulmonary disease, unspecified: Secondary | ICD-10-CM | POA: Diagnosis not present

## 2017-07-20 DIAGNOSIS — E119 Type 2 diabetes mellitus without complications: Secondary | ICD-10-CM | POA: Diagnosis not present

## 2017-07-20 DIAGNOSIS — I10 Essential (primary) hypertension: Secondary | ICD-10-CM | POA: Diagnosis not present

## 2017-07-23 DIAGNOSIS — L570 Actinic keratosis: Secondary | ICD-10-CM | POA: Diagnosis not present

## 2017-07-23 DIAGNOSIS — Z85828 Personal history of other malignant neoplasm of skin: Secondary | ICD-10-CM | POA: Diagnosis not present

## 2017-07-23 DIAGNOSIS — D485 Neoplasm of uncertain behavior of skin: Secondary | ICD-10-CM | POA: Diagnosis not present

## 2017-07-23 DIAGNOSIS — C44629 Squamous cell carcinoma of skin of left upper limb, including shoulder: Secondary | ICD-10-CM | POA: Diagnosis not present

## 2017-07-23 DIAGNOSIS — L299 Pruritus, unspecified: Secondary | ICD-10-CM | POA: Diagnosis not present

## 2017-08-21 DIAGNOSIS — I429 Cardiomyopathy, unspecified: Secondary | ICD-10-CM | POA: Diagnosis not present

## 2017-08-21 DIAGNOSIS — Z299 Encounter for prophylactic measures, unspecified: Secondary | ICD-10-CM | POA: Diagnosis not present

## 2017-08-21 DIAGNOSIS — Z6828 Body mass index (BMI) 28.0-28.9, adult: Secondary | ICD-10-CM | POA: Diagnosis not present

## 2017-08-21 DIAGNOSIS — E1122 Type 2 diabetes mellitus with diabetic chronic kidney disease: Secondary | ICD-10-CM | POA: Diagnosis not present

## 2017-08-21 DIAGNOSIS — E1165 Type 2 diabetes mellitus with hyperglycemia: Secondary | ICD-10-CM | POA: Diagnosis not present

## 2017-08-21 DIAGNOSIS — J449 Chronic obstructive pulmonary disease, unspecified: Secondary | ICD-10-CM | POA: Diagnosis not present

## 2017-08-21 DIAGNOSIS — N183 Chronic kidney disease, stage 3 (moderate): Secondary | ICD-10-CM | POA: Diagnosis not present

## 2017-08-23 DIAGNOSIS — C44629 Squamous cell carcinoma of skin of left upper limb, including shoulder: Secondary | ICD-10-CM | POA: Diagnosis not present

## 2017-08-23 DIAGNOSIS — L988 Other specified disorders of the skin and subcutaneous tissue: Secondary | ICD-10-CM | POA: Diagnosis not present

## 2017-09-06 DIAGNOSIS — Z4802 Encounter for removal of sutures: Secondary | ICD-10-CM | POA: Diagnosis not present

## 2017-09-28 DIAGNOSIS — Z7982 Long term (current) use of aspirin: Secondary | ICD-10-CM | POA: Diagnosis not present

## 2017-09-28 DIAGNOSIS — Z87891 Personal history of nicotine dependence: Secondary | ICD-10-CM | POA: Diagnosis not present

## 2017-09-28 DIAGNOSIS — R531 Weakness: Secondary | ICD-10-CM | POA: Diagnosis not present

## 2017-09-28 DIAGNOSIS — R112 Nausea with vomiting, unspecified: Secondary | ICD-10-CM | POA: Diagnosis not present

## 2017-09-28 DIAGNOSIS — R718 Other abnormality of red blood cells: Secondary | ICD-10-CM | POA: Diagnosis not present

## 2017-09-28 DIAGNOSIS — K529 Noninfective gastroenteritis and colitis, unspecified: Secondary | ICD-10-CM | POA: Diagnosis not present

## 2017-09-28 DIAGNOSIS — J449 Chronic obstructive pulmonary disease, unspecified: Secondary | ICD-10-CM | POA: Diagnosis not present

## 2017-09-28 DIAGNOSIS — Z79899 Other long term (current) drug therapy: Secondary | ICD-10-CM | POA: Diagnosis not present

## 2017-09-28 DIAGNOSIS — R197 Diarrhea, unspecified: Secondary | ICD-10-CM | POA: Diagnosis not present

## 2017-09-28 DIAGNOSIS — Z951 Presence of aortocoronary bypass graft: Secondary | ICD-10-CM | POA: Diagnosis not present

## 2017-09-28 DIAGNOSIS — I252 Old myocardial infarction: Secondary | ICD-10-CM | POA: Diagnosis not present

## 2017-09-28 DIAGNOSIS — D649 Anemia, unspecified: Secondary | ICD-10-CM | POA: Diagnosis not present

## 2017-09-28 DIAGNOSIS — Z8673 Personal history of transient ischemic attack (TIA), and cerebral infarction without residual deficits: Secondary | ICD-10-CM | POA: Diagnosis not present

## 2017-09-28 DIAGNOSIS — K219 Gastro-esophageal reflux disease without esophagitis: Secondary | ICD-10-CM | POA: Diagnosis not present

## 2017-11-21 DIAGNOSIS — Z85828 Personal history of other malignant neoplasm of skin: Secondary | ICD-10-CM | POA: Diagnosis not present

## 2017-11-21 DIAGNOSIS — L570 Actinic keratosis: Secondary | ICD-10-CM | POA: Diagnosis not present

## 2017-11-21 DIAGNOSIS — L280 Lichen simplex chronicus: Secondary | ICD-10-CM | POA: Diagnosis not present

## 2017-12-06 DIAGNOSIS — J61 Pneumoconiosis due to asbestos and other mineral fibers: Secondary | ICD-10-CM | POA: Diagnosis not present

## 2017-12-06 DIAGNOSIS — N183 Chronic kidney disease, stage 3 (moderate): Secondary | ICD-10-CM | POA: Diagnosis not present

## 2017-12-06 DIAGNOSIS — I429 Cardiomyopathy, unspecified: Secondary | ICD-10-CM | POA: Diagnosis not present

## 2017-12-06 DIAGNOSIS — E1122 Type 2 diabetes mellitus with diabetic chronic kidney disease: Secondary | ICD-10-CM | POA: Diagnosis not present

## 2017-12-06 DIAGNOSIS — I10 Essential (primary) hypertension: Secondary | ICD-10-CM | POA: Diagnosis not present

## 2017-12-06 DIAGNOSIS — J449 Chronic obstructive pulmonary disease, unspecified: Secondary | ICD-10-CM | POA: Diagnosis not present

## 2017-12-06 DIAGNOSIS — Z6827 Body mass index (BMI) 27.0-27.9, adult: Secondary | ICD-10-CM | POA: Diagnosis not present

## 2017-12-06 DIAGNOSIS — Z299 Encounter for prophylactic measures, unspecified: Secondary | ICD-10-CM | POA: Diagnosis not present

## 2017-12-06 DIAGNOSIS — E7800 Pure hypercholesterolemia, unspecified: Secondary | ICD-10-CM | POA: Diagnosis not present

## 2017-12-06 DIAGNOSIS — E1165 Type 2 diabetes mellitus with hyperglycemia: Secondary | ICD-10-CM | POA: Diagnosis not present

## 2018-01-21 DIAGNOSIS — Z1211 Encounter for screening for malignant neoplasm of colon: Secondary | ICD-10-CM | POA: Diagnosis not present

## 2018-01-21 DIAGNOSIS — Z0000 Encounter for general adult medical examination without abnormal findings: Secondary | ICD-10-CM | POA: Diagnosis not present

## 2018-01-21 DIAGNOSIS — Z299 Encounter for prophylactic measures, unspecified: Secondary | ICD-10-CM | POA: Diagnosis not present

## 2018-01-21 DIAGNOSIS — Z6827 Body mass index (BMI) 27.0-27.9, adult: Secondary | ICD-10-CM | POA: Diagnosis not present

## 2018-01-21 DIAGNOSIS — D539 Nutritional anemia, unspecified: Secondary | ICD-10-CM | POA: Diagnosis not present

## 2018-01-21 DIAGNOSIS — E7800 Pure hypercholesterolemia, unspecified: Secondary | ICD-10-CM | POA: Diagnosis not present

## 2018-01-21 DIAGNOSIS — I10 Essential (primary) hypertension: Secondary | ICD-10-CM | POA: Diagnosis not present

## 2018-01-21 DIAGNOSIS — N183 Chronic kidney disease, stage 3 (moderate): Secondary | ICD-10-CM | POA: Diagnosis not present

## 2018-01-21 DIAGNOSIS — Z1339 Encounter for screening examination for other mental health and behavioral disorders: Secondary | ICD-10-CM | POA: Diagnosis not present

## 2018-01-21 DIAGNOSIS — R5383 Other fatigue: Secondary | ICD-10-CM | POA: Diagnosis not present

## 2018-01-21 DIAGNOSIS — Z7189 Other specified counseling: Secondary | ICD-10-CM | POA: Diagnosis not present

## 2018-01-21 DIAGNOSIS — Z1331 Encounter for screening for depression: Secondary | ICD-10-CM | POA: Diagnosis not present

## 2018-01-21 DIAGNOSIS — Z79899 Other long term (current) drug therapy: Secondary | ICD-10-CM | POA: Diagnosis not present

## 2018-01-21 DIAGNOSIS — Z125 Encounter for screening for malignant neoplasm of prostate: Secondary | ICD-10-CM | POA: Diagnosis not present

## 2018-02-05 DIAGNOSIS — Z79899 Other long term (current) drug therapy: Secondary | ICD-10-CM | POA: Diagnosis not present

## 2018-02-05 DIAGNOSIS — Z87891 Personal history of nicotine dependence: Secondary | ICD-10-CM | POA: Diagnosis not present

## 2018-02-05 DIAGNOSIS — K219 Gastro-esophageal reflux disease without esophagitis: Secondary | ICD-10-CM | POA: Diagnosis not present

## 2018-02-05 DIAGNOSIS — N400 Enlarged prostate without lower urinary tract symptoms: Secondary | ICD-10-CM | POA: Diagnosis not present

## 2018-02-05 DIAGNOSIS — D649 Anemia, unspecified: Secondary | ICD-10-CM | POA: Diagnosis not present

## 2018-02-05 DIAGNOSIS — I2510 Atherosclerotic heart disease of native coronary artery without angina pectoris: Secondary | ICD-10-CM | POA: Diagnosis not present

## 2018-02-05 DIAGNOSIS — D539 Nutritional anemia, unspecified: Secondary | ICD-10-CM | POA: Diagnosis not present

## 2018-02-05 DIAGNOSIS — E785 Hyperlipidemia, unspecified: Secondary | ICD-10-CM | POA: Diagnosis not present

## 2018-02-05 DIAGNOSIS — Z7982 Long term (current) use of aspirin: Secondary | ICD-10-CM | POA: Diagnosis not present

## 2018-02-05 DIAGNOSIS — R5383 Other fatigue: Secondary | ICD-10-CM | POA: Diagnosis not present

## 2018-02-05 DIAGNOSIS — E119 Type 2 diabetes mellitus without complications: Secondary | ICD-10-CM | POA: Diagnosis not present

## 2018-02-05 DIAGNOSIS — I10 Essential (primary) hypertension: Secondary | ICD-10-CM | POA: Diagnosis not present

## 2018-02-05 DIAGNOSIS — I7090 Unspecified atherosclerosis: Secondary | ICD-10-CM | POA: Diagnosis not present

## 2018-02-05 DIAGNOSIS — J61 Pneumoconiosis due to asbestos and other mineral fibers: Secondary | ICD-10-CM | POA: Diagnosis not present

## 2018-02-12 DIAGNOSIS — D649 Anemia, unspecified: Secondary | ICD-10-CM | POA: Diagnosis not present

## 2018-02-12 DIAGNOSIS — Z888 Allergy status to other drugs, medicaments and biological substances status: Secondary | ICD-10-CM | POA: Diagnosis not present

## 2018-02-12 DIAGNOSIS — I252 Old myocardial infarction: Secondary | ICD-10-CM | POA: Diagnosis not present

## 2018-02-12 DIAGNOSIS — Z87891 Personal history of nicotine dependence: Secondary | ICD-10-CM | POA: Diagnosis not present

## 2018-02-12 DIAGNOSIS — I10 Essential (primary) hypertension: Secondary | ICD-10-CM | POA: Diagnosis not present

## 2018-02-12 DIAGNOSIS — Z885 Allergy status to narcotic agent status: Secondary | ICD-10-CM | POA: Diagnosis not present

## 2018-02-12 DIAGNOSIS — Z85828 Personal history of other malignant neoplasm of skin: Secondary | ICD-10-CM | POA: Diagnosis not present

## 2018-02-12 DIAGNOSIS — D539 Nutritional anemia, unspecified: Secondary | ICD-10-CM | POA: Diagnosis not present

## 2018-02-12 DIAGNOSIS — E119 Type 2 diabetes mellitus without complications: Secondary | ICD-10-CM | POA: Diagnosis not present

## 2018-02-12 DIAGNOSIS — K219 Gastro-esophageal reflux disease without esophagitis: Secondary | ICD-10-CM | POA: Diagnosis not present

## 2018-02-12 DIAGNOSIS — E785 Hyperlipidemia, unspecified: Secondary | ICD-10-CM | POA: Diagnosis not present

## 2018-02-12 DIAGNOSIS — N400 Enlarged prostate without lower urinary tract symptoms: Secondary | ICD-10-CM | POA: Diagnosis not present

## 2018-02-12 DIAGNOSIS — Z7982 Long term (current) use of aspirin: Secondary | ICD-10-CM | POA: Diagnosis not present

## 2018-02-12 DIAGNOSIS — Z79899 Other long term (current) drug therapy: Secondary | ICD-10-CM | POA: Diagnosis not present

## 2018-02-21 DIAGNOSIS — R5383 Other fatigue: Secondary | ICD-10-CM | POA: Diagnosis not present

## 2018-02-21 DIAGNOSIS — D649 Anemia, unspecified: Secondary | ICD-10-CM | POA: Diagnosis not present

## 2018-02-26 DIAGNOSIS — D539 Nutritional anemia, unspecified: Secondary | ICD-10-CM | POA: Diagnosis not present

## 2018-02-26 DIAGNOSIS — E1122 Type 2 diabetes mellitus with diabetic chronic kidney disease: Secondary | ICD-10-CM | POA: Diagnosis not present

## 2018-02-26 DIAGNOSIS — I2510 Atherosclerotic heart disease of native coronary artery without angina pectoris: Secondary | ICD-10-CM | POA: Diagnosis not present

## 2018-02-26 DIAGNOSIS — D631 Anemia in chronic kidney disease: Secondary | ICD-10-CM | POA: Diagnosis not present

## 2018-02-26 DIAGNOSIS — E785 Hyperlipidemia, unspecified: Secondary | ICD-10-CM | POA: Diagnosis not present

## 2018-02-26 DIAGNOSIS — Z79899 Other long term (current) drug therapy: Secondary | ICD-10-CM | POA: Diagnosis not present

## 2018-02-26 DIAGNOSIS — Z7982 Long term (current) use of aspirin: Secondary | ICD-10-CM | POA: Diagnosis not present

## 2018-02-26 DIAGNOSIS — N183 Chronic kidney disease, stage 3 (moderate): Secondary | ICD-10-CM | POA: Diagnosis not present

## 2018-02-26 DIAGNOSIS — N400 Enlarged prostate without lower urinary tract symptoms: Secondary | ICD-10-CM | POA: Diagnosis not present

## 2018-02-26 DIAGNOSIS — K219 Gastro-esophageal reflux disease without esophagitis: Secondary | ICD-10-CM | POA: Diagnosis not present

## 2018-02-26 DIAGNOSIS — I129 Hypertensive chronic kidney disease with stage 1 through stage 4 chronic kidney disease, or unspecified chronic kidney disease: Secondary | ICD-10-CM | POA: Diagnosis not present

## 2018-02-27 DIAGNOSIS — N183 Chronic kidney disease, stage 3 (moderate): Secondary | ICD-10-CM | POA: Diagnosis not present

## 2018-02-27 DIAGNOSIS — D631 Anemia in chronic kidney disease: Secondary | ICD-10-CM | POA: Diagnosis not present

## 2018-02-28 DIAGNOSIS — D631 Anemia in chronic kidney disease: Secondary | ICD-10-CM | POA: Diagnosis not present

## 2018-02-28 DIAGNOSIS — N183 Chronic kidney disease, stage 3 (moderate): Secondary | ICD-10-CM | POA: Diagnosis not present

## 2018-03-12 DIAGNOSIS — D631 Anemia in chronic kidney disease: Secondary | ICD-10-CM | POA: Diagnosis not present

## 2018-03-12 DIAGNOSIS — N183 Chronic kidney disease, stage 3 (moderate): Secondary | ICD-10-CM | POA: Diagnosis not present

## 2018-03-28 DIAGNOSIS — I10 Essential (primary) hypertension: Secondary | ICD-10-CM | POA: Diagnosis not present

## 2018-03-28 DIAGNOSIS — E1122 Type 2 diabetes mellitus with diabetic chronic kidney disease: Secondary | ICD-10-CM | POA: Diagnosis not present

## 2018-03-28 DIAGNOSIS — Z299 Encounter for prophylactic measures, unspecified: Secondary | ICD-10-CM | POA: Diagnosis not present

## 2018-03-28 DIAGNOSIS — J449 Chronic obstructive pulmonary disease, unspecified: Secondary | ICD-10-CM | POA: Diagnosis not present

## 2018-03-28 DIAGNOSIS — N183 Chronic kidney disease, stage 3 (moderate): Secondary | ICD-10-CM | POA: Diagnosis not present

## 2018-03-28 DIAGNOSIS — Z6827 Body mass index (BMI) 27.0-27.9, adult: Secondary | ICD-10-CM | POA: Diagnosis not present

## 2018-03-28 DIAGNOSIS — E1165 Type 2 diabetes mellitus with hyperglycemia: Secondary | ICD-10-CM | POA: Diagnosis not present

## 2018-04-02 DIAGNOSIS — I255 Ischemic cardiomyopathy: Secondary | ICD-10-CM | POA: Diagnosis not present

## 2018-04-02 DIAGNOSIS — J61 Pneumoconiosis due to asbestos and other mineral fibers: Secondary | ICD-10-CM | POA: Diagnosis not present

## 2018-04-02 DIAGNOSIS — Z7982 Long term (current) use of aspirin: Secondary | ICD-10-CM | POA: Diagnosis not present

## 2018-04-02 DIAGNOSIS — N183 Chronic kidney disease, stage 3 (moderate): Secondary | ICD-10-CM | POA: Diagnosis not present

## 2018-04-02 DIAGNOSIS — E785 Hyperlipidemia, unspecified: Secondary | ICD-10-CM | POA: Diagnosis not present

## 2018-04-02 DIAGNOSIS — Z79899 Other long term (current) drug therapy: Secondary | ICD-10-CM | POA: Diagnosis not present

## 2018-04-02 DIAGNOSIS — Z951 Presence of aortocoronary bypass graft: Secondary | ICD-10-CM | POA: Diagnosis not present

## 2018-04-02 DIAGNOSIS — N400 Enlarged prostate without lower urinary tract symptoms: Secondary | ICD-10-CM | POA: Diagnosis not present

## 2018-04-02 DIAGNOSIS — K219 Gastro-esophageal reflux disease without esophagitis: Secondary | ICD-10-CM | POA: Diagnosis not present

## 2018-04-02 DIAGNOSIS — D631 Anemia in chronic kidney disease: Secondary | ICD-10-CM | POA: Diagnosis not present

## 2018-04-02 DIAGNOSIS — E119 Type 2 diabetes mellitus without complications: Secondary | ICD-10-CM | POA: Diagnosis not present

## 2018-04-02 DIAGNOSIS — I252 Old myocardial infarction: Secondary | ICD-10-CM | POA: Diagnosis not present

## 2018-04-02 DIAGNOSIS — I2510 Atherosclerotic heart disease of native coronary artery without angina pectoris: Secondary | ICD-10-CM | POA: Diagnosis not present

## 2018-04-02 DIAGNOSIS — D539 Nutritional anemia, unspecified: Secondary | ICD-10-CM | POA: Diagnosis not present

## 2018-04-02 DIAGNOSIS — I10 Essential (primary) hypertension: Secondary | ICD-10-CM | POA: Diagnosis not present

## 2018-04-09 DIAGNOSIS — D631 Anemia in chronic kidney disease: Secondary | ICD-10-CM | POA: Diagnosis not present

## 2018-04-09 DIAGNOSIS — N183 Chronic kidney disease, stage 3 (moderate): Secondary | ICD-10-CM | POA: Diagnosis not present

## 2018-04-23 DIAGNOSIS — N183 Chronic kidney disease, stage 3 (moderate): Secondary | ICD-10-CM | POA: Diagnosis not present

## 2018-04-23 DIAGNOSIS — J302 Other seasonal allergic rhinitis: Secondary | ICD-10-CM | POA: Diagnosis not present

## 2018-04-23 DIAGNOSIS — N400 Enlarged prostate without lower urinary tract symptoms: Secondary | ICD-10-CM | POA: Diagnosis not present

## 2018-04-23 DIAGNOSIS — J61 Pneumoconiosis due to asbestos and other mineral fibers: Secondary | ICD-10-CM | POA: Diagnosis not present

## 2018-04-23 DIAGNOSIS — E785 Hyperlipidemia, unspecified: Secondary | ICD-10-CM | POA: Diagnosis not present

## 2018-04-23 DIAGNOSIS — Z951 Presence of aortocoronary bypass graft: Secondary | ICD-10-CM | POA: Diagnosis not present

## 2018-04-23 DIAGNOSIS — I2510 Atherosclerotic heart disease of native coronary artery without angina pectoris: Secondary | ICD-10-CM | POA: Diagnosis not present

## 2018-04-23 DIAGNOSIS — I252 Old myocardial infarction: Secondary | ICD-10-CM | POA: Diagnosis not present

## 2018-04-23 DIAGNOSIS — K219 Gastro-esophageal reflux disease without esophagitis: Secondary | ICD-10-CM | POA: Diagnosis not present

## 2018-04-23 DIAGNOSIS — D539 Nutritional anemia, unspecified: Secondary | ICD-10-CM | POA: Diagnosis not present

## 2018-04-23 DIAGNOSIS — I10 Essential (primary) hypertension: Secondary | ICD-10-CM | POA: Diagnosis not present

## 2018-04-23 DIAGNOSIS — Z79899 Other long term (current) drug therapy: Secondary | ICD-10-CM | POA: Diagnosis not present

## 2018-04-23 DIAGNOSIS — R5383 Other fatigue: Secondary | ICD-10-CM | POA: Diagnosis not present

## 2018-04-23 DIAGNOSIS — E119 Type 2 diabetes mellitus without complications: Secondary | ICD-10-CM | POA: Diagnosis not present

## 2018-04-23 DIAGNOSIS — D631 Anemia in chronic kidney disease: Secondary | ICD-10-CM | POA: Diagnosis not present

## 2018-05-08 DIAGNOSIS — L570 Actinic keratosis: Secondary | ICD-10-CM | POA: Diagnosis not present

## 2018-05-10 DIAGNOSIS — N183 Chronic kidney disease, stage 3 (moderate): Secondary | ICD-10-CM | POA: Diagnosis not present

## 2018-05-10 DIAGNOSIS — R5383 Other fatigue: Secondary | ICD-10-CM | POA: Diagnosis not present

## 2018-05-10 DIAGNOSIS — D631 Anemia in chronic kidney disease: Secondary | ICD-10-CM | POA: Diagnosis not present

## 2018-05-14 DIAGNOSIS — N183 Chronic kidney disease, stage 3 (moderate): Secondary | ICD-10-CM | POA: Diagnosis not present

## 2018-05-14 DIAGNOSIS — D631 Anemia in chronic kidney disease: Secondary | ICD-10-CM | POA: Diagnosis not present

## 2018-05-16 DIAGNOSIS — Z23 Encounter for immunization: Secondary | ICD-10-CM | POA: Diagnosis not present

## 2018-05-18 DIAGNOSIS — R1012 Left upper quadrant pain: Secondary | ICD-10-CM | POA: Diagnosis not present

## 2018-05-18 DIAGNOSIS — N189 Chronic kidney disease, unspecified: Secondary | ICD-10-CM | POA: Diagnosis present

## 2018-05-18 DIAGNOSIS — D649 Anemia, unspecified: Secondary | ICD-10-CM | POA: Diagnosis present

## 2018-05-18 DIAGNOSIS — J449 Chronic obstructive pulmonary disease, unspecified: Secondary | ICD-10-CM | POA: Diagnosis present

## 2018-05-18 DIAGNOSIS — Z951 Presence of aortocoronary bypass graft: Secondary | ICD-10-CM | POA: Diagnosis not present

## 2018-05-18 DIAGNOSIS — I129 Hypertensive chronic kidney disease with stage 1 through stage 4 chronic kidney disease, or unspecified chronic kidney disease: Secondary | ICD-10-CM | POA: Diagnosis present

## 2018-05-18 DIAGNOSIS — R109 Unspecified abdominal pain: Secondary | ICD-10-CM | POA: Diagnosis not present

## 2018-05-18 DIAGNOSIS — K5732 Diverticulitis of large intestine without perforation or abscess without bleeding: Secondary | ICD-10-CM | POA: Diagnosis not present

## 2018-05-18 DIAGNOSIS — I252 Old myocardial infarction: Secondary | ICD-10-CM | POA: Diagnosis not present

## 2018-05-18 DIAGNOSIS — Z87891 Personal history of nicotine dependence: Secondary | ICD-10-CM | POA: Diagnosis not present

## 2018-05-18 DIAGNOSIS — Z8673 Personal history of transient ischemic attack (TIA), and cerebral infarction without residual deficits: Secondary | ICD-10-CM | POA: Diagnosis not present

## 2018-05-18 DIAGNOSIS — I2510 Atherosclerotic heart disease of native coronary artery without angina pectoris: Secondary | ICD-10-CM | POA: Diagnosis present

## 2018-05-18 DIAGNOSIS — Z85828 Personal history of other malignant neoplasm of skin: Secondary | ICD-10-CM | POA: Diagnosis not present

## 2018-05-18 DIAGNOSIS — K5792 Diverticulitis of intestine, part unspecified, without perforation or abscess without bleeding: Secondary | ICD-10-CM | POA: Diagnosis not present

## 2018-05-18 DIAGNOSIS — R1032 Left lower quadrant pain: Secondary | ICD-10-CM | POA: Diagnosis not present

## 2018-05-18 DIAGNOSIS — E1122 Type 2 diabetes mellitus with diabetic chronic kidney disease: Secondary | ICD-10-CM | POA: Diagnosis present

## 2018-05-21 DIAGNOSIS — J302 Other seasonal allergic rhinitis: Secondary | ICD-10-CM | POA: Diagnosis not present

## 2018-05-21 DIAGNOSIS — Z09 Encounter for follow-up examination after completed treatment for conditions other than malignant neoplasm: Secondary | ICD-10-CM | POA: Diagnosis not present

## 2018-05-21 DIAGNOSIS — I10 Essential (primary) hypertension: Secondary | ICD-10-CM | POA: Diagnosis not present

## 2018-05-21 DIAGNOSIS — N183 Chronic kidney disease, stage 3 (moderate): Secondary | ICD-10-CM | POA: Diagnosis not present

## 2018-05-21 DIAGNOSIS — Z79899 Other long term (current) drug therapy: Secondary | ICD-10-CM | POA: Diagnosis not present

## 2018-05-21 DIAGNOSIS — Z7982 Long term (current) use of aspirin: Secondary | ICD-10-CM | POA: Diagnosis not present

## 2018-05-21 DIAGNOSIS — Z91041 Radiographic dye allergy status: Secondary | ICD-10-CM | POA: Diagnosis not present

## 2018-05-21 DIAGNOSIS — I252 Old myocardial infarction: Secondary | ICD-10-CM | POA: Diagnosis not present

## 2018-05-21 DIAGNOSIS — Z6826 Body mass index (BMI) 26.0-26.9, adult: Secondary | ICD-10-CM | POA: Diagnosis not present

## 2018-05-21 DIAGNOSIS — K219 Gastro-esophageal reflux disease without esophagitis: Secondary | ICD-10-CM | POA: Diagnosis not present

## 2018-05-21 DIAGNOSIS — D539 Nutritional anemia, unspecified: Secondary | ICD-10-CM | POA: Diagnosis not present

## 2018-05-21 DIAGNOSIS — R5383 Other fatigue: Secondary | ICD-10-CM | POA: Diagnosis not present

## 2018-05-21 DIAGNOSIS — K760 Fatty (change of) liver, not elsewhere classified: Secondary | ICD-10-CM | POA: Diagnosis not present

## 2018-05-21 DIAGNOSIS — N400 Enlarged prostate without lower urinary tract symptoms: Secondary | ICD-10-CM | POA: Diagnosis not present

## 2018-05-21 DIAGNOSIS — E785 Hyperlipidemia, unspecified: Secondary | ICD-10-CM | POA: Diagnosis not present

## 2018-05-21 DIAGNOSIS — I2510 Atherosclerotic heart disease of native coronary artery without angina pectoris: Secondary | ICD-10-CM | POA: Diagnosis not present

## 2018-05-21 DIAGNOSIS — K5792 Diverticulitis of intestine, part unspecified, without perforation or abscess without bleeding: Secondary | ICD-10-CM | POA: Diagnosis not present

## 2018-05-21 DIAGNOSIS — C4490 Unspecified malignant neoplasm of skin, unspecified: Secondary | ICD-10-CM | POA: Diagnosis not present

## 2018-05-21 DIAGNOSIS — Z951 Presence of aortocoronary bypass graft: Secondary | ICD-10-CM | POA: Diagnosis not present

## 2018-05-21 DIAGNOSIS — D631 Anemia in chronic kidney disease: Secondary | ICD-10-CM | POA: Diagnosis not present

## 2018-05-21 DIAGNOSIS — I129 Hypertensive chronic kidney disease with stage 1 through stage 4 chronic kidney disease, or unspecified chronic kidney disease: Secondary | ICD-10-CM | POA: Diagnosis not present

## 2018-05-21 DIAGNOSIS — Z299 Encounter for prophylactic measures, unspecified: Secondary | ICD-10-CM | POA: Diagnosis not present

## 2018-05-21 DIAGNOSIS — E1165 Type 2 diabetes mellitus with hyperglycemia: Secondary | ICD-10-CM | POA: Diagnosis not present

## 2018-05-21 DIAGNOSIS — E1122 Type 2 diabetes mellitus with diabetic chronic kidney disease: Secondary | ICD-10-CM | POA: Diagnosis not present

## 2018-05-22 DIAGNOSIS — N183 Chronic kidney disease, stage 3 (moderate): Secondary | ICD-10-CM | POA: Diagnosis not present

## 2018-05-22 DIAGNOSIS — D631 Anemia in chronic kidney disease: Secondary | ICD-10-CM | POA: Diagnosis not present

## 2018-06-05 DIAGNOSIS — D539 Nutritional anemia, unspecified: Secondary | ICD-10-CM | POA: Diagnosis not present

## 2018-06-18 DIAGNOSIS — D631 Anemia in chronic kidney disease: Secondary | ICD-10-CM | POA: Diagnosis not present

## 2018-06-18 DIAGNOSIS — D539 Nutritional anemia, unspecified: Secondary | ICD-10-CM | POA: Diagnosis not present

## 2018-06-18 DIAGNOSIS — N183 Chronic kidney disease, stage 3 (moderate): Secondary | ICD-10-CM | POA: Diagnosis not present

## 2018-06-20 DIAGNOSIS — D631 Anemia in chronic kidney disease: Secondary | ICD-10-CM | POA: Diagnosis not present

## 2018-06-20 DIAGNOSIS — N183 Chronic kidney disease, stage 3 (moderate): Secondary | ICD-10-CM | POA: Diagnosis not present

## 2018-07-02 DIAGNOSIS — D631 Anemia in chronic kidney disease: Secondary | ICD-10-CM | POA: Diagnosis not present

## 2018-07-02 DIAGNOSIS — N183 Chronic kidney disease, stage 3 (moderate): Secondary | ICD-10-CM | POA: Diagnosis not present

## 2018-07-02 DIAGNOSIS — R5383 Other fatigue: Secondary | ICD-10-CM | POA: Diagnosis not present

## 2018-07-08 DIAGNOSIS — I10 Essential (primary) hypertension: Secondary | ICD-10-CM | POA: Diagnosis not present

## 2018-07-08 DIAGNOSIS — E1165 Type 2 diabetes mellitus with hyperglycemia: Secondary | ICD-10-CM | POA: Diagnosis not present

## 2018-07-08 DIAGNOSIS — E1122 Type 2 diabetes mellitus with diabetic chronic kidney disease: Secondary | ICD-10-CM | POA: Diagnosis not present

## 2018-07-08 DIAGNOSIS — Z299 Encounter for prophylactic measures, unspecified: Secondary | ICD-10-CM | POA: Diagnosis not present

## 2018-07-08 DIAGNOSIS — D649 Anemia, unspecified: Secondary | ICD-10-CM | POA: Diagnosis not present

## 2018-07-08 DIAGNOSIS — N183 Chronic kidney disease, stage 3 (moderate): Secondary | ICD-10-CM | POA: Diagnosis not present

## 2018-07-08 DIAGNOSIS — Z6825 Body mass index (BMI) 25.0-25.9, adult: Secondary | ICD-10-CM | POA: Diagnosis not present

## 2018-07-10 DIAGNOSIS — D649 Anemia, unspecified: Secondary | ICD-10-CM | POA: Diagnosis not present

## 2018-07-11 DIAGNOSIS — D649 Anemia, unspecified: Secondary | ICD-10-CM | POA: Diagnosis not present

## 2018-07-17 DIAGNOSIS — N183 Chronic kidney disease, stage 3 (moderate): Secondary | ICD-10-CM | POA: Diagnosis not present

## 2018-07-17 DIAGNOSIS — D631 Anemia in chronic kidney disease: Secondary | ICD-10-CM | POA: Diagnosis not present

## 2018-07-17 DIAGNOSIS — R5383 Other fatigue: Secondary | ICD-10-CM | POA: Diagnosis not present

## 2018-07-19 DIAGNOSIS — D631 Anemia in chronic kidney disease: Secondary | ICD-10-CM | POA: Diagnosis not present

## 2018-07-19 DIAGNOSIS — N183 Chronic kidney disease, stage 3 (moderate): Secondary | ICD-10-CM | POA: Diagnosis not present

## 2018-07-23 DIAGNOSIS — I255 Ischemic cardiomyopathy: Secondary | ICD-10-CM | POA: Diagnosis not present

## 2018-07-23 DIAGNOSIS — N183 Chronic kidney disease, stage 3 (moderate): Secondary | ICD-10-CM | POA: Diagnosis not present

## 2018-07-23 DIAGNOSIS — D631 Anemia in chronic kidney disease: Secondary | ICD-10-CM | POA: Diagnosis not present

## 2018-08-14 DIAGNOSIS — S020XXS Fracture of vault of skull, sequela: Secondary | ICD-10-CM | POA: Diagnosis not present

## 2018-08-14 DIAGNOSIS — Z515 Encounter for palliative care: Secondary | ICD-10-CM | POA: Diagnosis not present

## 2018-08-14 DIAGNOSIS — S065X9A Traumatic subdural hemorrhage with loss of consciousness of unspecified duration, initial encounter: Secondary | ICD-10-CM | POA: Diagnosis not present

## 2018-08-14 DIAGNOSIS — E878 Other disorders of electrolyte and fluid balance, not elsewhere classified: Secondary | ICD-10-CM | POA: Diagnosis not present

## 2018-08-14 DIAGNOSIS — G8911 Acute pain due to trauma: Secondary | ICD-10-CM | POA: Diagnosis not present

## 2018-08-14 DIAGNOSIS — S0291XA Unspecified fracture of skull, initial encounter for closed fracture: Secondary | ICD-10-CM | POA: Diagnosis present

## 2018-08-14 DIAGNOSIS — R079 Chest pain, unspecified: Secondary | ICD-10-CM | POA: Diagnosis not present

## 2018-08-14 DIAGNOSIS — Z951 Presence of aortocoronary bypass graft: Secondary | ICD-10-CM | POA: Diagnosis not present

## 2018-08-14 DIAGNOSIS — E785 Hyperlipidemia, unspecified: Secondary | ICD-10-CM | POA: Diagnosis not present

## 2018-08-14 DIAGNOSIS — M25552 Pain in left hip: Secondary | ICD-10-CM | POA: Diagnosis not present

## 2018-08-14 DIAGNOSIS — Z66 Do not resuscitate: Secondary | ICD-10-CM | POA: Diagnosis not present

## 2018-08-14 DIAGNOSIS — J90 Pleural effusion, not elsewhere classified: Secondary | ICD-10-CM | POA: Diagnosis not present

## 2018-08-14 DIAGNOSIS — F0390 Unspecified dementia without behavioral disturbance: Secondary | ICD-10-CM | POA: Diagnosis present

## 2018-08-14 DIAGNOSIS — S32050A Wedge compression fracture of fifth lumbar vertebra, initial encounter for closed fracture: Secondary | ICD-10-CM | POA: Diagnosis present

## 2018-08-14 DIAGNOSIS — I609 Nontraumatic subarachnoid hemorrhage, unspecified: Secondary | ICD-10-CM | POA: Diagnosis not present

## 2018-08-14 DIAGNOSIS — I959 Hypotension, unspecified: Secondary | ICD-10-CM | POA: Diagnosis not present

## 2018-08-14 DIAGNOSIS — D649 Anemia, unspecified: Secondary | ICD-10-CM | POA: Diagnosis not present

## 2018-08-14 DIAGNOSIS — S066X9A Traumatic subarachnoid hemorrhage with loss of consciousness of unspecified duration, initial encounter: Secondary | ICD-10-CM | POA: Diagnosis not present

## 2018-08-14 DIAGNOSIS — R402363 Coma scale, best motor response, obeys commands, at hospital admission: Secondary | ICD-10-CM | POA: Diagnosis present

## 2018-08-14 DIAGNOSIS — S299XXA Unspecified injury of thorax, initial encounter: Secondary | ICD-10-CM | POA: Diagnosis not present

## 2018-08-14 DIAGNOSIS — J9601 Acute respiratory failure with hypoxia: Secondary | ICD-10-CM | POA: Diagnosis not present

## 2018-08-14 DIAGNOSIS — Z91041 Radiographic dye allergy status: Secondary | ICD-10-CM | POA: Diagnosis not present

## 2018-08-14 DIAGNOSIS — J449 Chronic obstructive pulmonary disease, unspecified: Secondary | ICD-10-CM | POA: Diagnosis not present

## 2018-08-14 DIAGNOSIS — S2243XD Multiple fractures of ribs, bilateral, subsequent encounter for fracture with routine healing: Secondary | ICD-10-CM | POA: Diagnosis not present

## 2018-08-14 DIAGNOSIS — M5136 Other intervertebral disc degeneration, lumbar region: Secondary | ICD-10-CM | POA: Diagnosis not present

## 2018-08-14 DIAGNOSIS — I252 Old myocardial infarction: Secondary | ICD-10-CM | POA: Diagnosis not present

## 2018-08-14 DIAGNOSIS — R400 Somnolence: Secondary | ICD-10-CM | POA: Diagnosis not present

## 2018-08-14 DIAGNOSIS — I4891 Unspecified atrial fibrillation: Secondary | ICD-10-CM | POA: Diagnosis not present

## 2018-08-14 DIAGNOSIS — S06339A Contusion and laceration of cerebrum, unspecified, with loss of consciousness of unspecified duration, initial encounter: Secondary | ICD-10-CM | POA: Diagnosis not present

## 2018-08-14 DIAGNOSIS — S3992XA Unspecified injury of lower back, initial encounter: Secondary | ICD-10-CM | POA: Diagnosis not present

## 2018-08-14 DIAGNOSIS — W51XXXA Accidental striking against or bumped into by another person, initial encounter: Secondary | ICD-10-CM | POA: Diagnosis not present

## 2018-08-14 DIAGNOSIS — S06369A Traumatic hemorrhage of cerebrum, unspecified, with loss of consciousness of unspecified duration, initial encounter: Secondary | ICD-10-CM | POA: Diagnosis not present

## 2018-08-14 DIAGNOSIS — Z960 Presence of urogenital implants: Secondary | ICD-10-CM | POA: Diagnosis not present

## 2018-08-14 DIAGNOSIS — I2510 Atherosclerotic heart disease of native coronary artery without angina pectoris: Secondary | ICD-10-CM | POA: Diagnosis present

## 2018-08-14 DIAGNOSIS — N401 Enlarged prostate with lower urinary tract symptoms: Secondary | ICD-10-CM | POA: Diagnosis present

## 2018-08-14 DIAGNOSIS — R918 Other nonspecific abnormal finding of lung field: Secondary | ICD-10-CM | POA: Diagnosis not present

## 2018-08-14 DIAGNOSIS — G9340 Encephalopathy, unspecified: Secondary | ICD-10-CM | POA: Diagnosis not present

## 2018-08-14 DIAGNOSIS — Z888 Allergy status to other drugs, medicaments and biological substances status: Secondary | ICD-10-CM | POA: Diagnosis not present

## 2018-08-14 DIAGNOSIS — R338 Other retention of urine: Secondary | ICD-10-CM | POA: Diagnosis not present

## 2018-08-14 DIAGNOSIS — J810 Acute pulmonary edema: Secondary | ICD-10-CM | POA: Diagnosis not present

## 2018-08-14 DIAGNOSIS — I429 Cardiomyopathy, unspecified: Secondary | ICD-10-CM | POA: Diagnosis present

## 2018-08-14 DIAGNOSIS — I700 Atherosclerosis of aorta: Secondary | ICD-10-CM | POA: Diagnosis not present

## 2018-08-14 DIAGNOSIS — M542 Cervicalgia: Secondary | ICD-10-CM | POA: Diagnosis not present

## 2018-08-14 DIAGNOSIS — G9341 Metabolic encephalopathy: Secondary | ICD-10-CM | POA: Diagnosis not present

## 2018-08-14 DIAGNOSIS — W1839XA Other fall on same level, initial encounter: Secondary | ICD-10-CM | POA: Diagnosis not present

## 2018-08-14 DIAGNOSIS — Z4659 Encounter for fitting and adjustment of other gastrointestinal appliance and device: Secondary | ICD-10-CM | POA: Diagnosis not present

## 2018-08-14 DIAGNOSIS — R451 Restlessness and agitation: Secondary | ICD-10-CM | POA: Diagnosis not present

## 2018-08-14 DIAGNOSIS — R5381 Other malaise: Secondary | ICD-10-CM | POA: Diagnosis not present

## 2018-08-14 DIAGNOSIS — D62 Acute posthemorrhagic anemia: Secondary | ICD-10-CM | POA: Diagnosis present

## 2018-08-14 DIAGNOSIS — D631 Anemia in chronic kidney disease: Secondary | ICD-10-CM | POA: Diagnosis present

## 2018-08-14 DIAGNOSIS — K219 Gastro-esophageal reflux disease without esophagitis: Secondary | ICD-10-CM | POA: Diagnosis not present

## 2018-08-14 DIAGNOSIS — R402253 Coma scale, best verbal response, oriented, at hospital admission: Secondary | ICD-10-CM | POA: Diagnosis present

## 2018-08-14 DIAGNOSIS — N183 Chronic kidney disease, stage 3 (moderate): Secondary | ICD-10-CM | POA: Diagnosis present

## 2018-08-14 DIAGNOSIS — S065X9D Traumatic subdural hemorrhage with loss of consciousness of unspecified duration, subsequent encounter: Secondary | ICD-10-CM | POA: Diagnosis not present

## 2018-08-14 DIAGNOSIS — W03XXXA Other fall on same level due to collision with another person, initial encounter: Secondary | ICD-10-CM | POA: Diagnosis not present

## 2018-08-14 DIAGNOSIS — S06369S Traumatic hemorrhage of cerebrum, unspecified, with loss of consciousness of unspecified duration, sequela: Secondary | ICD-10-CM | POA: Diagnosis not present

## 2018-08-14 DIAGNOSIS — S065X9S Traumatic subdural hemorrhage with loss of consciousness of unspecified duration, sequela: Secondary | ICD-10-CM | POA: Diagnosis not present

## 2018-08-14 DIAGNOSIS — Y998 Other external cause status: Secondary | ICD-10-CM | POA: Diagnosis not present

## 2018-08-14 DIAGNOSIS — E1122 Type 2 diabetes mellitus with diabetic chronic kidney disease: Secondary | ICD-10-CM | POA: Diagnosis present

## 2018-08-14 DIAGNOSIS — R1310 Dysphagia, unspecified: Secondary | ICD-10-CM | POA: Diagnosis not present

## 2018-08-14 DIAGNOSIS — Z87891 Personal history of nicotine dependence: Secondary | ICD-10-CM | POA: Diagnosis not present

## 2018-08-14 DIAGNOSIS — Z043 Encounter for examination and observation following other accident: Secondary | ICD-10-CM | POA: Diagnosis not present

## 2018-08-14 DIAGNOSIS — S0281XA Fracture of other specified skull and facial bones, right side, initial encounter for closed fracture: Secondary | ICD-10-CM | POA: Diagnosis not present

## 2018-08-14 DIAGNOSIS — S32050D Wedge compression fracture of fifth lumbar vertebra, subsequent encounter for fracture with routine healing: Secondary | ICD-10-CM | POA: Diagnosis not present

## 2018-08-14 DIAGNOSIS — W108XXA Fall (on) (from) other stairs and steps, initial encounter: Secondary | ICD-10-CM | POA: Diagnosis not present

## 2018-08-14 DIAGNOSIS — R410 Disorientation, unspecified: Secondary | ICD-10-CM | POA: Diagnosis not present

## 2018-08-14 DIAGNOSIS — K5900 Constipation, unspecified: Secondary | ICD-10-CM | POA: Diagnosis not present

## 2018-08-14 DIAGNOSIS — S2242XA Multiple fractures of ribs, left side, initial encounter for closed fracture: Secondary | ICD-10-CM | POA: Diagnosis not present

## 2018-08-14 DIAGNOSIS — S066X0A Traumatic subarachnoid hemorrhage without loss of consciousness, initial encounter: Secondary | ICD-10-CM | POA: Diagnosis present

## 2018-08-14 DIAGNOSIS — S06350A Traumatic hemorrhage of left cerebrum without loss of consciousness, initial encounter: Secondary | ICD-10-CM | POA: Diagnosis not present

## 2018-08-14 DIAGNOSIS — S32040A Wedge compression fracture of fourth lumbar vertebra, initial encounter for closed fracture: Secondary | ICD-10-CM | POA: Diagnosis present

## 2018-08-14 DIAGNOSIS — S065X0A Traumatic subdural hemorrhage without loss of consciousness, initial encounter: Secondary | ICD-10-CM | POA: Diagnosis present

## 2018-08-14 DIAGNOSIS — R402143 Coma scale, eyes open, spontaneous, at hospital admission: Secondary | ICD-10-CM | POA: Diagnosis present

## 2018-08-14 DIAGNOSIS — M545 Low back pain: Secondary | ICD-10-CM | POA: Diagnosis not present

## 2018-08-14 DIAGNOSIS — Z8674 Personal history of sudden cardiac arrest: Secondary | ICD-10-CM | POA: Diagnosis not present

## 2018-08-14 DIAGNOSIS — S066X9S Traumatic subarachnoid hemorrhage with loss of consciousness of unspecified duration, sequela: Secondary | ICD-10-CM | POA: Diagnosis not present

## 2018-08-14 DIAGNOSIS — R109 Unspecified abdominal pain: Secondary | ICD-10-CM | POA: Diagnosis not present

## 2018-08-14 DIAGNOSIS — S2232XA Fracture of one rib, left side, initial encounter for closed fracture: Secondary | ICD-10-CM | POA: Diagnosis not present

## 2018-08-14 DIAGNOSIS — J9811 Atelectasis: Secondary | ICD-10-CM | POA: Diagnosis not present

## 2018-08-14 DIAGNOSIS — Y92019 Unspecified place in single-family (private) house as the place of occurrence of the external cause: Secondary | ICD-10-CM | POA: Diagnosis not present

## 2018-08-14 DIAGNOSIS — R339 Retention of urine, unspecified: Secondary | ICD-10-CM | POA: Diagnosis not present

## 2018-08-14 DIAGNOSIS — M47812 Spondylosis without myelopathy or radiculopathy, cervical region: Secondary | ICD-10-CM | POA: Diagnosis not present

## 2018-08-14 DIAGNOSIS — I6789 Other cerebrovascular disease: Secondary | ICD-10-CM | POA: Diagnosis not present

## 2018-08-14 DIAGNOSIS — R40241 Glasgow coma scale score 13-15, unspecified time: Secondary | ICD-10-CM | POA: Diagnosis not present

## 2018-08-14 DIAGNOSIS — S2241XA Multiple fractures of ribs, right side, initial encounter for closed fracture: Secondary | ICD-10-CM | POA: Diagnosis not present

## 2018-08-14 DIAGNOSIS — Z8673 Personal history of transient ischemic attack (TIA), and cerebral infarction without residual deficits: Secondary | ICD-10-CM | POA: Diagnosis not present

## 2018-08-14 DIAGNOSIS — M79652 Pain in left thigh: Secondary | ICD-10-CM | POA: Diagnosis not present

## 2018-08-14 DIAGNOSIS — Z79899 Other long term (current) drug therapy: Secondary | ICD-10-CM | POA: Diagnosis not present

## 2018-08-14 DIAGNOSIS — S020XXA Fracture of vault of skull, initial encounter for closed fracture: Secondary | ICD-10-CM | POA: Diagnosis not present

## 2018-08-14 DIAGNOSIS — S32040D Wedge compression fracture of fourth lumbar vertebra, subsequent encounter for fracture with routine healing: Secondary | ICD-10-CM | POA: Diagnosis not present

## 2018-08-14 DIAGNOSIS — Z7982 Long term (current) use of aspirin: Secondary | ICD-10-CM | POA: Diagnosis not present

## 2018-08-14 DIAGNOSIS — S2243XA Multiple fractures of ribs, bilateral, initial encounter for closed fracture: Secondary | ICD-10-CM | POA: Diagnosis not present

## 2018-09-07 DEATH — deceased
